# Patient Record
Sex: Female | Born: 1955 | Race: White | Hispanic: No | Smoking: Current every day smoker
Health system: Southern US, Community
[De-identification: ages and names within clinical notes are randomized; demographics above are authoritative.]

## PROBLEM LIST (undated history)

## (undated) DIAGNOSIS — F4310 Post-traumatic stress disorder, unspecified: Secondary | ICD-10-CM

## (undated) DIAGNOSIS — I10 Essential (primary) hypertension: Secondary | ICD-10-CM

## (undated) DIAGNOSIS — G43909 Migraine, unspecified, not intractable, without status migrainosus: Secondary | ICD-10-CM

## (undated) DIAGNOSIS — N390 Urinary tract infection, site not specified: Secondary | ICD-10-CM

## (undated) DIAGNOSIS — F419 Anxiety disorder, unspecified: Secondary | ICD-10-CM

## (undated) DIAGNOSIS — K219 Gastro-esophageal reflux disease without esophagitis: Secondary | ICD-10-CM

## (undated) DIAGNOSIS — L989 Disorder of the skin and subcutaneous tissue, unspecified: Secondary | ICD-10-CM

## (undated) DIAGNOSIS — F909 Attention-deficit hyperactivity disorder, unspecified type: Secondary | ICD-10-CM

## (undated) DIAGNOSIS — T7840XA Allergy, unspecified, initial encounter: Secondary | ICD-10-CM

## (undated) DIAGNOSIS — J3081 Allergic rhinitis due to animal (cat) (dog) hair and dander: Secondary | ICD-10-CM

## (undated) DIAGNOSIS — M8580 Other specified disorders of bone density and structure, unspecified site: Secondary | ICD-10-CM

## (undated) DIAGNOSIS — M1990 Unspecified osteoarthritis, unspecified site: Secondary | ICD-10-CM

## (undated) DIAGNOSIS — J189 Pneumonia, unspecified organism: Secondary | ICD-10-CM

## (undated) HISTORY — DX: Attention-deficit hyperactivity disorder, unspecified type: F90.9

## (undated) HISTORY — DX: Gastro-esophageal reflux disease without esophagitis: K21.9

## (undated) HISTORY — DX: Allergy, unspecified, initial encounter: T78.40XA

## (undated) HISTORY — DX: Post-traumatic stress disorder, unspecified: F43.10

## (undated) HISTORY — PX: NOSE SURGERY: SHX723

## (undated) HISTORY — DX: Urinary tract infection, site not specified: N39.0

## (undated) HISTORY — DX: Essential (primary) hypertension: I10

## (undated) HISTORY — PX: BASAL CELL CARCINOMA EXCISION: SHX1214

## (undated) HISTORY — DX: Anxiety disorder, unspecified: F41.9

## (undated) HISTORY — PX: BREAST ENHANCEMENT SURGERY: SHX7

## (undated) HISTORY — DX: Disorder of the skin and subcutaneous tissue, unspecified: L98.9

## (undated) HISTORY — PX: LIPOSUCTION: SHX10

## (undated) HISTORY — DX: Migraine, unspecified, not intractable, without status migrainosus: G43.909

---

## 2005-08-03 ENCOUNTER — Inpatient Hospital Stay (HOSPITAL_COMMUNITY): Admission: RE | Admit: 2005-08-03 | Discharge: 2005-08-13 | Payer: Self-pay | Admitting: *Deleted

## 2005-08-03 ENCOUNTER — Emergency Department (HOSPITAL_COMMUNITY): Admission: EM | Admit: 2005-08-03 | Discharge: 2005-08-03 | Payer: Self-pay | Admitting: Emergency Medicine

## 2005-08-04 ENCOUNTER — Ambulatory Visit: Payer: Self-pay | Admitting: *Deleted

## 2005-08-13 ENCOUNTER — Other Ambulatory Visit (HOSPITAL_COMMUNITY): Admission: RE | Admit: 2005-08-13 | Discharge: 2005-11-11 | Payer: Self-pay | Admitting: Psychiatry

## 2005-09-26 ENCOUNTER — Ambulatory Visit: Payer: Self-pay | Admitting: Psychiatry

## 2005-10-31 ENCOUNTER — Ambulatory Visit: Payer: Self-pay | Admitting: Psychiatry

## 2006-12-04 ENCOUNTER — Ambulatory Visit: Payer: Self-pay | Admitting: Internal Medicine

## 2006-12-04 LAB — CONVERTED CEMR LAB
Albumin: 5 g/dL (ref 3.5–5.2)
BUN: 15 mg/dL (ref 6–23)
CO2: 22 meq/L (ref 19–32)
Calcium: 9.5 mg/dL (ref 8.4–10.5)
Chloride: 108 meq/L (ref 96–112)
Glucose, Bld: 98 mg/dL (ref 70–99)
HDL: 74 mg/dL (ref >39)
Sodium: 142 meq/L (ref 135–145)
TSH: 0.554 microintl units/mL (ref 0.350–5.50)
Total Bilirubin: 0.3 mg/dL (ref 0.3–1.2)
Total CHOL/HDL Ratio: 3.1
Triglycerides: 186 mg/dL — ABNORMAL HIGH (ref <150)
VLDL: 37 mg/dL (ref 0–40)

## 2006-12-09 ENCOUNTER — Ambulatory Visit: Payer: Self-pay | Admitting: Internal Medicine

## 2006-12-09 DIAGNOSIS — F329 Major depressive disorder, single episode, unspecified: Secondary | ICD-10-CM

## 2006-12-09 DIAGNOSIS — F4321 Adjustment disorder with depressed mood: Secondary | ICD-10-CM | POA: Insufficient documentation

## 2006-12-26 ENCOUNTER — Ambulatory Visit: Payer: Self-pay | Admitting: Internal Medicine

## 2006-12-27 ENCOUNTER — Ambulatory Visit: Payer: Self-pay | Admitting: *Deleted

## 2007-01-30 ENCOUNTER — Ambulatory Visit: Payer: Self-pay | Admitting: Internal Medicine

## 2007-01-31 ENCOUNTER — Ambulatory Visit (HOSPITAL_COMMUNITY): Admission: RE | Admit: 2007-01-31 | Discharge: 2007-01-31 | Payer: Self-pay | Admitting: Internal Medicine

## 2007-03-25 ENCOUNTER — Ambulatory Visit: Payer: Self-pay | Admitting: Internal Medicine

## 2007-05-28 ENCOUNTER — Ambulatory Visit: Payer: Self-pay | Admitting: Internal Medicine

## 2007-07-30 ENCOUNTER — Ambulatory Visit: Payer: Self-pay | Admitting: Internal Medicine

## 2007-08-01 ENCOUNTER — Ambulatory Visit: Payer: Self-pay | Admitting: Internal Medicine

## 2007-08-29 ENCOUNTER — Ambulatory Visit: Payer: Self-pay | Admitting: Internal Medicine

## 2008-01-01 ENCOUNTER — Ambulatory Visit: Payer: Self-pay | Admitting: Internal Medicine

## 2008-03-04 ENCOUNTER — Ambulatory Visit: Payer: Self-pay | Admitting: Internal Medicine

## 2008-07-27 ENCOUNTER — Ambulatory Visit: Payer: Self-pay | Admitting: Internal Medicine

## 2008-12-02 ENCOUNTER — Ambulatory Visit: Payer: Self-pay | Admitting: Internal Medicine

## 2008-12-02 LAB — CONVERTED CEMR LAB
Calcium: 9.4 mg/dL (ref 8.4–10.5)
Chloride: 101 meq/L (ref 96–112)
HDL: 113 mg/dL (ref >39)
LDL Cholesterol: 89 mg/dL (ref 0–99)
Triglycerides: 76 mg/dL (ref <150)
VLDL: 15 mg/dL (ref 0–40)

## 2008-12-29 ENCOUNTER — Encounter (INDEPENDENT_AMBULATORY_CARE_PROVIDER_SITE_OTHER): Payer: Self-pay | Admitting: Internal Medicine

## 2008-12-29 ENCOUNTER — Ambulatory Visit: Payer: Self-pay | Admitting: Internal Medicine

## 2009-01-03 ENCOUNTER — Ambulatory Visit (HOSPITAL_COMMUNITY): Admission: RE | Admit: 2009-01-03 | Discharge: 2009-01-03 | Payer: Self-pay | Admitting: Internal Medicine

## 2009-01-24 ENCOUNTER — Ambulatory Visit: Payer: Self-pay | Admitting: Internal Medicine

## 2009-04-26 ENCOUNTER — Ambulatory Visit: Payer: Self-pay | Admitting: Family Medicine

## 2009-04-26 ENCOUNTER — Other Ambulatory Visit: Admission: RE | Admit: 2009-04-26 | Discharge: 2009-04-26 | Payer: Self-pay | Admitting: Family Medicine

## 2009-06-14 ENCOUNTER — Ambulatory Visit: Payer: Self-pay | Admitting: Internal Medicine

## 2009-06-15 ENCOUNTER — Ambulatory Visit (HOSPITAL_COMMUNITY): Admission: RE | Admit: 2009-06-15 | Discharge: 2009-06-15 | Payer: Self-pay | Admitting: Internal Medicine

## 2009-06-16 ENCOUNTER — Ambulatory Visit: Payer: Self-pay | Admitting: Internal Medicine

## 2009-10-31 ENCOUNTER — Ambulatory Visit: Payer: Self-pay | Admitting: Internal Medicine

## 2010-03-12 ENCOUNTER — Encounter: Payer: Self-pay | Admitting: Internal Medicine

## 2010-04-13 ENCOUNTER — Encounter (INDEPENDENT_AMBULATORY_CARE_PROVIDER_SITE_OTHER): Payer: Self-pay | Admitting: Family Medicine

## 2010-04-13 LAB — CONVERTED CEMR LAB
ALT: 31 units/L (ref 0–35)
AST: 36 units/L (ref 0–37)
Albumin: 4.7 g/dL (ref 3.5–5.2)
Alkaline Phosphatase: 89 units/L (ref 39–117)
BUN: 12 mg/dL (ref 6–23)
CO2: 23 meq/L (ref 19–32)
Calcium: 9.6 mg/dL (ref 8.4–10.5)
Chloride: 102 meq/L (ref 96–112)
Cholesterol: 269 mg/dL — ABNORMAL HIGH (ref 0–200)
Creatinine, Ser: 0.63 mg/dL (ref 0.40–1.20)
Glucose, Bld: 93 mg/dL (ref 70–99)
HDL: 77 mg/dL (ref >39)
LDL Cholesterol: 162 mg/dL — ABNORMAL HIGH (ref 0–99)
Potassium: 4.5 meq/L (ref 3.5–5.3)
Sodium: 140 meq/L (ref 135–145)
Total Bilirubin: 0.6 mg/dL (ref 0.3–1.2)
Total CHOL/HDL Ratio: 3.5
Total Protein: 7.4 g/dL (ref 6.0–8.3)
Triglycerides: 149 mg/dL (ref <150)
VLDL: 30 mg/dL (ref 0–40)
Vit D, 25-Hydroxy: 44 ng/mL (ref 30–89)

## 2010-07-07 NOTE — Discharge Summary (Signed)
NAME:  Julie Carson, Julie Carson          ACCOUNT NO.:  [id number]   MEDICAL RECORD NO.:  [id number]          PATIENT TYPE:  IPS   LOCATION:  0303                          FACILITY:  [facility]   PHYSICIAN:  Anselm Jungling, MD  DATE OF BIRTH:  1955-07-01   DATE OF ADMISSION:  08/03/2005  DATE OF DISCHARGE:  08/13/2005                                 DISCHARGE SUMMARY   IDENTIFYING DATA AND REASON FOR ADMISSION:  This was the first [facility] admission  for Julie Carson, a 55 year old unmarried Caucasian female who was admitted for  treatment of substance abuse and depression.  She had a long history of  alcoholism, and had had some periods of sobriety previously but had relapsed  some time prior to admission.  She also had a history of depression.  Please  refer to the admission note for further details pertaining to the symptoms,  circumstances and history that led to her hospitalization.   MEDICAL AND LABORATORY:  The patient was evaluated medically and physically  by the psychiatric nurse practitioner.  She came to [place] without any chronic  or active medical problems, essentially in good health.  Admission  laboratory showed mild elevations of liver function enzymes, but normal  bilirubin indices, and essentially normal CBC.   HOSPITAL COURSE:  The patient was admitted to the adult inpatient  psychiatric service.  She was placed on a withdrawal protocol based upon  Librium.  In addition she was placed on Campral 666 mg t.i.d..  Celexa 20  mg, 1/2 tablet daily was prescribed for depression.   The patient reported that in previous instances of coming off alcohol and  benzodiazepines, that she experienced grand mal seizure.  Based upon this  reporting, the patient was placed on a further regimen of Tegretol 300 mg  t.i.d..  All of the above medications were well tolerated.  The patient had  mild to moderate anxiety and tremulousness throughout much of her inpatient  stay.  Sleep was generally fairly good with  Ambien 10 mg at bedtime.   The patient was a good participant in the treatment program.  She presented  as a well nourished, well-developed, organized, bright and articulate  individual who was strongly motivated to achieve sobriety and long-term  recovery.  She was a good participant in treatment program and along with  peers and staff alike.   The patient was felt to be ready for discharge on the 11th hospital day at  which time she was transferred directly to the intensive outpatient chemical  dependency program here at [facility].   AFTERCARE:  The patient was to follow-up on the very same day of discharge  at the IOP program.  There, she will be followed by Dr. Dub Mikes for medication  maintenance.   DISCHARGE MEDICATIONS:  Campral 666 mg t.i.d., Tegretol 300 mg b.i.d.,  Celexa 10 mg daily, Vistaril 25 mg up to t.i.d. as needed for anxiety,  Librium 10 mg t.i.d. on June 25 and June 26 and Librium 10 mg b.i.d. on June  27 and June 28, and Librium 10 mg q.a.m. on June 29 and June 30, then  DC  Librium, and Ambien 10 mg h.s. p.r.n. insomnia.   DISCHARGE DIAGNOSES:  AXIS I: Alcohol dependence, benzodiazepine dependence,  depressive disorder NOS.  AXIS II: Deferred.  AXIS III: No acute or chronic illnesses.  AXIS IV: Stressors severe.  AXIS V: Global assessment of functioning on discharge 75.           ______________________________  Anselm Jungling, MD  Electronically Signed     SPB/MEDQ  D:  08/13/2005  T:  08/13/2005  Job:  [id number]

## 2011-02-20 DIAGNOSIS — L989 Disorder of the skin and subcutaneous tissue, unspecified: Secondary | ICD-10-CM

## 2011-02-20 HISTORY — DX: Disorder of the skin and subcutaneous tissue, unspecified: L98.9

## 2011-08-22 ENCOUNTER — Other Ambulatory Visit (HOSPITAL_COMMUNITY): Payer: Self-pay | Admitting: Family Medicine

## 2011-08-22 DIAGNOSIS — D179 Benign lipomatous neoplasm, unspecified: Secondary | ICD-10-CM

## 2011-08-28 ENCOUNTER — Ambulatory Visit (HOSPITAL_COMMUNITY)
Admission: RE | Admit: 2011-08-28 | Discharge: 2011-08-28 | Disposition: A | Discharge status: Home / Self Care | Payer: Self-pay | Source: Ambulatory Visit | Attending: Family Medicine | Admitting: Family Medicine

## 2011-08-28 DIAGNOSIS — D179 Benign lipomatous neoplasm, unspecified: Secondary | ICD-10-CM | POA: Insufficient documentation

## 2011-08-29 ENCOUNTER — Other Ambulatory Visit (HOSPITAL_COMMUNITY): Payer: Self-pay | Admitting: Family Medicine

## 2011-08-29 ENCOUNTER — Ambulatory Visit (HOSPITAL_COMMUNITY)
Admission: RE | Admit: 2011-08-29 | Discharge: 2011-08-29 | Disposition: A | Discharge status: Home / Self Care | Payer: Self-pay | Source: Ambulatory Visit | Attending: Family Medicine | Admitting: Family Medicine

## 2011-08-30 ENCOUNTER — Ambulatory Visit (HOSPITAL_COMMUNITY)
Admission: RE | Admit: 2011-08-30 | Discharge: 2011-08-30 | Disposition: A | Discharge status: Home / Self Care | Payer: Self-pay | Source: Ambulatory Visit | Attending: Family Medicine | Admitting: Family Medicine

## 2011-08-30 DIAGNOSIS — M67919 Unspecified disorder of synovium and tendon, unspecified shoulder: Secondary | ICD-10-CM | POA: Insufficient documentation

## 2011-08-30 DIAGNOSIS — M719 Bursopathy, unspecified: Secondary | ICD-10-CM | POA: Insufficient documentation

## 2011-08-30 DIAGNOSIS — S46819A Strain of other muscles, fascia and tendons at shoulder and upper arm level, unspecified arm, initial encounter: Secondary | ICD-10-CM | POA: Insufficient documentation

## 2011-08-30 DIAGNOSIS — X58XXXA Exposure to other specified factors, initial encounter: Secondary | ICD-10-CM | POA: Insufficient documentation

## 2011-08-30 DIAGNOSIS — R229 Localized swelling, mass and lump, unspecified: Secondary | ICD-10-CM | POA: Insufficient documentation

## 2011-08-30 DIAGNOSIS — Z01818 Encounter for other preprocedural examination: Secondary | ICD-10-CM | POA: Insufficient documentation

## 2011-08-30 DIAGNOSIS — Z01812 Encounter for preprocedural laboratory examination: Secondary | ICD-10-CM | POA: Insufficient documentation

## 2011-08-30 LAB — CREATININE, SERUM
GFR calc Af Amer: >90 mL/min (ref >90)
GFR calc non Af Amer: >90 mL/min (ref >90)

## 2011-08-30 MED ORDER — GADOBENATE DIMEGLUMINE 529 MG/ML IV SOLN
10.0000 mL | Freq: Once | INTRAVENOUS | Status: AC
  Administered 2011-08-30: 10 mL via INTRAVENOUS

## 2011-09-28 ENCOUNTER — Encounter (INDEPENDENT_AMBULATORY_CARE_PROVIDER_SITE_OTHER): Payer: Self-pay

## 2011-10-02 ENCOUNTER — Encounter (INDEPENDENT_AMBULATORY_CARE_PROVIDER_SITE_OTHER): Payer: Self-pay | Admitting: General Surgery

## 2011-10-02 ENCOUNTER — Ambulatory Visit (INDEPENDENT_AMBULATORY_CARE_PROVIDER_SITE_OTHER): Payer: PRIVATE HEALTH INSURANCE | Admitting: General Surgery

## 2011-10-02 VITALS — BP 138/82 | HR 84 | Temp 97.0°F | Resp 18 | Ht 62.0 in | Wt 137.0 lb

## 2011-10-02 DIAGNOSIS — D1720 Benign lipomatous neoplasm of skin and subcutaneous tissue of unspecified limb: Secondary | ICD-10-CM | POA: Insufficient documentation

## 2011-10-02 DIAGNOSIS — R2230 Localized swelling, mass and lump, unspecified upper limb: Secondary | ICD-10-CM

## 2011-10-02 DIAGNOSIS — M259 Joint disorder, unspecified: Secondary | ICD-10-CM

## 2011-10-02 NOTE — Patient Instructions (Signed)
YOu will be scheduled for surgery.  Please refer to the schedulers information for date and time.

## 2011-10-02 NOTE — Progress Notes (Signed)
Chief Complaint  Patient presents with  . Mass    HISTORY: 56yo F who has had a mass on her L shoulder for 3 years that is slowly growing.  She states that the area has become painful.  She has had an [place], which was suspicious and an MRI which showed a fibrous lesion within her subcutaneous fat just superior to her deltoid but could not rule out malignancy.  She is here today to discuss biopsy.  She has a history of a fall with injury to that side 3 yrs ago.  Past Medical History  Diagnosis Date  . Anxiety   . GERD (gastroesophageal reflux disease)   . PTSD (post-traumatic stress disorder)   . ADHD (attention deficit hyperactivity disorder)   . Migraine   . UTI (urinary tract infection)   . Hypertension   . Allergy   . Lesion of subcutaneous tissue 2013    Deltoid    PSH:  Cosmetic surgeries, Basal cell carcinoma- L chest/neck - 1980's  Current Outpatient Prescriptions  Medication Sig Dispense Refill  . ALPRAZolam (XANAX) 0.5 MG tablet Take 0.5 mg by mouth 3 (three) times daily as needed.       Marland Kitchen amphetamine-dextroamphetamine (ADDERALL) 20 MG tablet Take 20 mg by mouth 2 (two) times daily.      . hydrochlorothiazide (HYDRODIURIL) 25 MG tablet Take 25 mg by mouth daily.      Marland Kitchen omeprazole (PRILOSEC) 40 MG capsule Take 40 mg by mouth daily.      . ranitidine (ZANTAC) 150 MG tablet Take 150 mg by mouth 2 (two) times daily as needed.       No Known Allergies   FH: CVA, CAD   History   Social History  . Marital Status: Divorced    Spouse Name: N/A    Number of Children: N/A  . Years of Education: N/A   Social History Main Topics  . Smoking status: Never Smoker   . Smokeless tobacco: None  . Alcohol Use: Yes  . Drug Use: No  . Sexually Active: None   Other Topics Concern  . None   Social History Narrative  . None   REVIEW OF SYSTEMS - PERTINENT POSITIVES ONLY: 12 point review of systems negative other than HPI and PMH except for nasal congestion, palpitations,  arthritis, headaches and weakness  EXAM: Filed Vitals:   10/02/11 0904  BP: 138/82  Pulse: 84  Temp: 97 F (36.1 C)  Resp: 18   Gen:  No acute distress.  Well nourished and well groomed.   Neurological: Alert and oriented to person, place, and time. Coordination normal. Anxious  Head: Normocephalic and atraumatic.  Eyes: Conjunctivae are normal. Pupils are equal, round, and reactive to light. No scleral icterus.  Neck: Normal range of motion. Neck supple. No tracheal deviation or thyromegaly present.  No cervical lymphadenopathy. Cardiovascular: Normal rate, regular rhythm, normal heart sounds and intact distal pulses.  Exam reveals no gallop and no friction rub.  No murmur heard. Respiratory: Effort normal.  No respiratory distress. No chest wall tenderness. Breath sounds normal.  No wheezes, rales or rhonchi.  GI: Soft. Bowel sounds are normal. The abdomen is soft and nontender.  There is no rebound and no guarding.  Musculoskeletal: Normal range of motion. L shoulder mass at deltoid tendon, tender to palpation, nonmobile ~2-3cm in size.  No palpable L axillary lymphadenopathy Skin: Skin is warm and dry. No rash noted. No diaphoresis. No erythema. No pallor. No clubbing, cyanosis, or  edema.   Psychiatric: Normal mood and affect. Behavior is normal. Judgment and thought content normal.   RADIOLOGY RESULTS:   Images and reports are reviewed MR (08/29/11): 1. Irregular fibrous lesion in the subcutaneous fat along the posterior inferior margin of the deltoid muscle demonstrates enhancement following contrast. Differential considerations include post-traumatic fibrosis, nodular fasciitis and superficial fibromatosis. An early malignant fibrous lesion cannot be excluded. If the patient has not had prior trauma to this area, excisional biopsy of this lesion should be considered. If the lesion is not removed, close clinical follow-up is warranted.  2. Underlying left shoulder glenohumeral  degenerative changes and synovial inflammation.  3. Partial insertional tear of the distal supraspinatus tendon, suboptimally evaluated.  [place] (08/22/11) 2.5 cm irregular, hypoechoic mass with posterior physical shadowing at the location of the palpable abnormality on the left. This has ultrasound features highly suspicious for malignancy. Further evaluation with pre and postcontrast magnetic resonance imaging of this area is recommended.    ASSESSMENT AND Plan L shoulder mass.  Could not rule out malignancy on MRI.  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  Will get incisional biopsy to confirm.  Discussed with the patient that I will only be getting a biopsy and not removing the mass.  I discussed with her the need for an orthopedic surgeon if this happens to be something more serious.  She was agreeable to this plan and will be scheduled for outpatient surgery.   Vanita Panda, MD Colon and Rectal Surgery / General Surgery [facility]      Visit Diagnoses: 1. Mass of shoulder region     Primary Care Physician: Georganna Skeans, MD

## 2011-10-05 ENCOUNTER — Ambulatory Visit (INDEPENDENT_AMBULATORY_CARE_PROVIDER_SITE_OTHER): Payer: Self-pay | Admitting: General Surgery

## 2011-10-09 ENCOUNTER — Telehealth (INDEPENDENT_AMBULATORY_CARE_PROVIDER_SITE_OTHER): Payer: Self-pay

## 2011-10-09 NOTE — Telephone Encounter (Signed)
The patient is starting to get anxious about surgery and has questions.  She has been reading her image reports.  She is concerned for cancer.  I told her until tissue is removed and sent to pathology we don't know what the mass is.  I read Dr Maisie Fus' note that said she was just doing a bx and not removing the mass.  The patient wishes it would just be removed.  She also wants to know what kind of anesthesia she will undergo.  I told her I can have Dr Maisie Fus call her and answer these questions for her.  The patient would like that.  She said she can call her home # or cell.  I told her it may be tomorrow.  She is ok with that.

## 2011-10-10 ENCOUNTER — Telehealth (INDEPENDENT_AMBULATORY_CARE_PROVIDER_SITE_OTHER): Payer: Self-pay | Admitting: General Surgery

## 2011-10-10 NOTE — Telephone Encounter (Signed)
Called patient and discussed her questions.  It sounds like she is mostly anxious about the outcome of her biopsy.  All questions answered.  We will proceed as planned.

## 2011-10-12 ENCOUNTER — Encounter (HOSPITAL_COMMUNITY): Payer: Self-pay | Admitting: Pharmacy Technician

## 2011-10-17 ENCOUNTER — Encounter (HOSPITAL_COMMUNITY)
Admission: RE | Admit: 2011-10-17 | Discharge: 2011-10-17 | Disposition: A | Discharge status: Home / Self Care | Payer: Self-pay | Source: Ambulatory Visit | Attending: General Surgery | Admitting: General Surgery

## 2011-10-17 ENCOUNTER — Ambulatory Visit (HOSPITAL_COMMUNITY)
Admission: RE | Admit: 2011-10-17 | Discharge: 2011-10-17 | Disposition: A | Discharge status: Home / Self Care | Payer: Self-pay | Source: Ambulatory Visit | Attending: General Surgery | Admitting: General Surgery

## 2011-10-17 ENCOUNTER — Encounter (HOSPITAL_COMMUNITY): Payer: Self-pay

## 2011-10-17 ENCOUNTER — Other Ambulatory Visit (INDEPENDENT_AMBULATORY_CARE_PROVIDER_SITE_OTHER): Payer: Self-pay | Admitting: General Surgery

## 2011-10-17 ENCOUNTER — Telehealth (INDEPENDENT_AMBULATORY_CARE_PROVIDER_SITE_OTHER): Payer: Self-pay

## 2011-10-17 DIAGNOSIS — Z01812 Encounter for preprocedural laboratory examination: Secondary | ICD-10-CM | POA: Insufficient documentation

## 2011-10-17 DIAGNOSIS — Z01818 Encounter for other preprocedural examination: Secondary | ICD-10-CM | POA: Insufficient documentation

## 2011-10-17 HISTORY — DX: Pneumonia, unspecified organism: J18.9

## 2011-10-17 HISTORY — DX: Unspecified osteoarthritis, unspecified site: M19.90

## 2011-10-17 HISTORY — DX: Other specified disorders of bone density and structure, unspecified site: M85.80

## 2011-10-17 HISTORY — DX: Allergic rhinitis due to animal (cat) (dog) hair and dander: J30.81

## 2011-10-17 LAB — CBC
HCT: 42.5 % (ref 36.0–46.0)
MCHC: 32.9 g/dL (ref 30.0–36.0)
Platelets: 271 10*3/uL (ref 150–400)
RDW: 13.2 % (ref 11.5–15.5)

## 2011-10-17 LAB — BASIC METABOLIC PANEL
BUN: 9 mg/dL (ref 6–23)
Calcium: 10.3 mg/dL (ref 8.4–10.5)
Creatinine, Ser: 0.55 mg/dL (ref 0.50–1.10)
GFR calc Af Amer: >90 mL/min (ref >90)
GFR calc non Af Amer: >90 mL/min (ref >90)

## 2011-10-17 LAB — SURGICAL PCR SCREEN: MRSA, PCR: NEGATIVE

## 2011-10-17 NOTE — Telephone Encounter (Signed)
Nurse calling b/c the consent for pt's surgery form is not complete. Pls look at surgical consent in epic orders.

## 2011-10-17 NOTE — Pre-Procedure Instructions (Signed)
18 Julie Carson  10/17/2011   Your procedure is scheduled on:  Wednesday October 24, 2011.  Report to [facility] at 0630 AM.  Call this number if you have problems the morning of surgery: [phone]   Remember:   Do not eat food or drink:After Midnight.    Take these medicines the morning of surgery with A SIP OF WATER: Alprazolam (Xanax) if needed for anxiety, and Ranitidine (Zantac)   Do not wear jewelry, make-up or nail polish.  Do not wear lotions, powders, or perfumes.   Do not shave 48 hours prior to surgery.   Do not bring valuables to the hospital.  Contacts, dentures or bridgework may not be worn into surgery.  Leave suitcase in the car. After surgery it may be brought to your room.  For patients admitted to the hospital, checkout time is 11:00 AM the day of discharge.   Patients discharged the day of surgery will not be allowed to drive home.  Name and phone number of your driver:   Special Instructions: CHG Shower Use Special Wash: 1/2 bottle night before surgery and 1/2 bottle morning of surgery.   Please read over the following fact sheets that you were given: Pain Booklet, Coughing and Deep Breathing, MRSA Information and Surgical Site Infection Prevention

## 2011-10-17 NOTE — Progress Notes (Signed)
Nurse called Dr. Maurine Minister office and left message with staff informing them that the consent form did not have the location of which shoulder the biopsy would be taken from. Office staff informed Nurse that Dr. Maisie Fus was not in the office but they would send a message to her about my concern. Patient aware that consent form will be signed the DOS.

## 2011-10-24 ENCOUNTER — Encounter (HOSPITAL_COMMUNITY)
Admission: RE | Disposition: A | Discharge status: Home / Self Care | Payer: Self-pay | Source: Ambulatory Visit | Attending: General Surgery

## 2011-10-24 ENCOUNTER — Ambulatory Visit (HOSPITAL_COMMUNITY): Payer: Self-pay | Admitting: Anesthesiology

## 2011-10-24 ENCOUNTER — Encounter (HOSPITAL_COMMUNITY): Payer: Self-pay | Admitting: Anesthesiology

## 2011-10-24 ENCOUNTER — Ambulatory Visit (HOSPITAL_COMMUNITY)
Admission: RE | Admit: 2011-10-24 | Discharge: 2011-10-24 | Disposition: A | Discharge status: Home / Self Care | Payer: Self-pay | Source: Ambulatory Visit | Attending: General Surgery | Admitting: General Surgery

## 2011-10-24 DIAGNOSIS — D1739 Benign lipomatous neoplasm of skin and subcutaneous tissue of other sites: Secondary | ICD-10-CM | POA: Insufficient documentation

## 2011-10-24 DIAGNOSIS — Z87440 Personal history of urinary (tract) infections: Secondary | ICD-10-CM | POA: Insufficient documentation

## 2011-10-24 DIAGNOSIS — Z87891 Personal history of nicotine dependence: Secondary | ICD-10-CM | POA: Insufficient documentation

## 2011-10-24 DIAGNOSIS — F4310 Post-traumatic stress disorder, unspecified: Secondary | ICD-10-CM | POA: Insufficient documentation

## 2011-10-24 DIAGNOSIS — K219 Gastro-esophageal reflux disease without esophagitis: Secondary | ICD-10-CM | POA: Insufficient documentation

## 2011-10-24 DIAGNOSIS — F909 Attention-deficit hyperactivity disorder, unspecified type: Secondary | ICD-10-CM | POA: Insufficient documentation

## 2011-10-24 DIAGNOSIS — E042 Nontoxic multinodular goiter: Secondary | ICD-10-CM

## 2011-10-24 DIAGNOSIS — I10 Essential (primary) hypertension: Secondary | ICD-10-CM | POA: Insufficient documentation

## 2011-10-24 HISTORY — PX: MASS EXCISION: SHX2000

## 2011-10-24 SURGERY — EXCISION MASS
Anesthesia: General | Site: Arm Upper | Laterality: Left | Wound class: Clean

## 2011-10-24 MED ORDER — CEFAZOLIN SODIUM-DEXTROSE 2-3 GM-% IV SOLR
INTRAVENOUS | Status: AC
  Filled 2011-10-24: qty 50

## 2011-10-24 MED ORDER — OXYCODONE-ACETAMINOPHEN 5-325 MG PO TABS: 1.0000 | ORAL_TABLET | ORAL | Status: AC | PRN

## 2011-10-24 MED ORDER — LIDOCAINE HCL (CARDIAC) 20 MG/ML IV SOLN
INTRAVENOUS | Status: DC | PRN
  Administered 2011-10-24: 100 mg via INTRAVENOUS

## 2011-10-24 MED ORDER — OXYCODONE HCL 5 MG PO TABS
ORAL_TABLET | ORAL | Status: AC
  Filled 2011-10-24: qty 2

## 2011-10-24 MED ORDER — PROMETHAZINE HCL 25 MG/ML IJ SOLN: 6.2500 mg | INTRAMUSCULAR | Status: DC | PRN

## 2011-10-24 MED ORDER — ACETAMINOPHEN 650 MG RE SUPP: 650.0000 mg | RECTAL | Status: DC | PRN

## 2011-10-24 MED ORDER — FENTANYL CITRATE 0.05 MG/ML IJ SOLN
INTRAMUSCULAR | Status: DC | PRN
  Administered 2011-10-24: 100 ug via INTRAVENOUS
  Administered 2011-10-24: 50 ug via INTRAVENOUS
  Administered 2011-10-24: 100 ug via INTRAVENOUS

## 2011-10-24 MED ORDER — SODIUM CHLORIDE 0.9 % IJ SOLN: 3.0000 mL | Freq: Two times a day (BID) | INTRAMUSCULAR | Status: DC

## 2011-10-24 MED ORDER — PROPOFOL 10 MG/ML IV EMUL
INTRAVENOUS | Status: DC | PRN
  Administered 2011-10-24: 200 mg via INTRAVENOUS

## 2011-10-24 MED ORDER — OXYCODONE HCL 5 MG PO TABS
5.0000 mg | ORAL_TABLET | ORAL | Status: DC | PRN
  Administered 2011-10-24: 10 mg via ORAL

## 2011-10-24 MED ORDER — CEFAZOLIN SODIUM-DEXTROSE 2-3 GM-% IV SOLR
INTRAVENOUS | Status: DC | PRN
  Administered 2011-10-24: 2 g via INTRAVENOUS

## 2011-10-24 MED ORDER — ACETAMINOPHEN 325 MG PO TABS: 650.0000 mg | ORAL_TABLET | ORAL | Status: DC | PRN

## 2011-10-24 MED ORDER — ONDANSETRON HCL 4 MG/2ML IJ SOLN: 4.0000 mg | Freq: Four times a day (QID) | INTRAMUSCULAR | Status: DC | PRN

## 2011-10-24 MED ORDER — MIDAZOLAM HCL 5 MG/5ML IJ SOLN
INTRAMUSCULAR | Status: DC | PRN
  Administered 2011-10-24 (×2): 2 mg via INTRAVENOUS

## 2011-10-24 MED ORDER — 0.9 % SODIUM CHLORIDE (POUR BTL) OPTIME
TOPICAL | Status: DC | PRN
  Administered 2011-10-24: 1000 mL

## 2011-10-24 MED ORDER — ONDANSETRON HCL 4 MG/2ML IJ SOLN
INTRAMUSCULAR | Status: DC | PRN
  Administered 2011-10-24: 4 mg via INTRAVENOUS

## 2011-10-24 MED ORDER — BUPIVACAINE-EPINEPHRINE PF 0.25-1:200000 % IJ SOLN
INTRAMUSCULAR | Status: AC
  Filled 2011-10-24: qty 30

## 2011-10-24 MED ORDER — HYDROMORPHONE HCL PF 1 MG/ML IJ SOLN
INTRAMUSCULAR | Status: AC
  Filled 2011-10-24: qty 1

## 2011-10-24 MED ORDER — SODIUM CHLORIDE 0.9 % IV SOLN: 250.0000 mL | INTRAVENOUS | Status: DC | PRN

## 2011-10-24 MED ORDER — BUPIVACAINE-EPINEPHRINE 0.25% -1:200000 IJ SOLN
INTRAMUSCULAR | Status: DC | PRN
  Administered 2011-10-24: 10 mL

## 2011-10-24 MED ORDER — LACTATED RINGERS IV SOLN
INTRAVENOUS | Status: DC | PRN
  Administered 2011-10-24: 09:00:00 via INTRAVENOUS

## 2011-10-24 MED ORDER — SODIUM CHLORIDE 0.9 % IJ SOLN
3.0000 mL | INTRAMUSCULAR | Status: DC | PRN
Start: 2011-10-24 — End: 2011-10-24

## 2011-10-24 MED ORDER — HYDROMORPHONE HCL PF 1 MG/ML IJ SOLN
0.2500 mg | INTRAMUSCULAR | Status: DC | PRN
  Administered 2011-10-24: 0.5 mg via INTRAVENOUS

## 2011-10-24 SURGICAL SUPPLY — 47 items
BLADE SURG 15 STRL LF DISP TIS (BLADE) ×2 IMPLANT
BLADE SURG 15 STRL SS (BLADE) ×1
CANISTER SUCTION 2500CC (MISCELLANEOUS) IMPLANT
CHLORAPREP W/TINT 10.5 ML (MISCELLANEOUS) ×3 IMPLANT
CHLORAPREP W/TINT 26ML (MISCELLANEOUS) IMPLANT
CLOTH BEACON ORANGE TIMEOUT ST (SAFETY) ×3 IMPLANT
CONT SPEC STER OR (MISCELLANEOUS) IMPLANT
COVER SURGICAL LIGHT HANDLE (MISCELLANEOUS) ×3 IMPLANT
DERMABOND ADHESIVE PROPEN (GAUZE/BANDAGES/DRESSINGS) ×1
DERMABOND ADVANCED (GAUZE/BANDAGES/DRESSINGS)
DERMABOND ADVANCED .7 DNX12 (GAUZE/BANDAGES/DRESSINGS) IMPLANT
DERMABOND ADVANCED .7 DNX6 (GAUZE/BANDAGES/DRESSINGS) ×2 IMPLANT
DRAPE PED LAPAROTOMY (DRAPES) ×3 IMPLANT
DRAPE UTILITY 15X26 W/TAPE STR (DRAPE) ×6 IMPLANT
DRESSING TELFA 8X3 (GAUZE/BANDAGES/DRESSINGS) IMPLANT
ELECT CAUTERY BLADE 6.4 (BLADE) ×3 IMPLANT
ELECT REM PT RETURN 9FT ADLT (ELECTROSURGICAL) ×3
ELECTRODE REM PT RTRN 9FT ADLT (ELECTROSURGICAL) ×2 IMPLANT
GAUZE SPONGE 4X4 16PLY XRAY LF (GAUZE/BANDAGES/DRESSINGS) ×3 IMPLANT
GLOVE BIO SURGEON STRL SZ 6.5 (GLOVE) ×3 IMPLANT
GLOVE BIO SURGEON STRL SZ7 (GLOVE) IMPLANT
GLOVE BIO SURGEON STRL SZ8 (GLOVE) ×3 IMPLANT
GLOVE BIOGEL PI IND STRL 7.0 (GLOVE) ×6 IMPLANT
GLOVE BIOGEL PI IND STRL 7.5 (GLOVE) IMPLANT
GLOVE BIOGEL PI INDICATOR 7.0 (GLOVE) ×3
GLOVE BIOGEL PI INDICATOR 7.5 (GLOVE)
GLOVE ECLIPSE 6.0 STRL STRAW (GLOVE) ×3 IMPLANT
GLOVE SURG SS PI 7.0 STRL IVOR (GLOVE) ×3 IMPLANT
GOWN PREVENTION PLUS XLARGE (GOWN DISPOSABLE) ×3 IMPLANT
GOWN PREVENTION PLUS XXLARGE (GOWN DISPOSABLE) ×3 IMPLANT
GOWN STRL NON-REIN LRG LVL3 (GOWN DISPOSABLE) ×6 IMPLANT
KIT BASIN OR (CUSTOM PROCEDURE TRAY) ×3 IMPLANT
KIT ROOM TURNOVER OR (KITS) ×3 IMPLANT
NEEDLE HYPO 25X1 1.5 SAFETY (NEEDLE) ×3 IMPLANT
NS IRRIG 1000ML POUR BTL (IV SOLUTION) ×3 IMPLANT
PACK SURGICAL SETUP 50X90 (CUSTOM PROCEDURE TRAY) ×3 IMPLANT
PAD ARMBOARD 7.5X6 YLW CONV (MISCELLANEOUS) ×6 IMPLANT
PENCIL BUTTON HOLSTER BLD 10FT (ELECTRODE) ×3 IMPLANT
SUT MON AB 4-0 PC3 18 (SUTURE) ×3 IMPLANT
SUT VIC AB 3-0 SH 18 (SUTURE) ×3 IMPLANT
SYR BULB 3OZ (MISCELLANEOUS) ×3 IMPLANT
SYR CONTROL 10ML LL (SYRINGE) ×3 IMPLANT
TOWEL OR 17X24 6PK STRL BLUE (TOWEL DISPOSABLE) IMPLANT
TOWEL OR 17X26 10 PK STRL BLUE (TOWEL DISPOSABLE) ×3 IMPLANT
TUBE CONNECTING 12X1/4 (SUCTIONS) IMPLANT
WATER STERILE IRR 1000ML POUR (IV SOLUTION) IMPLANT
YANKAUER SUCT BULB TIP NO VENT (SUCTIONS) IMPLANT

## 2011-10-24 NOTE — Progress Notes (Signed)
Patient voiced complaint of pain in left deltoid from surgical biopsy and rated it originally as "10" out of 10. Nurse called Asher Muir in OR room 1 and voiced patients concerns to her. While on phone patient rated pain as "8" out of 10. Jamie informed Nurse that Dr. Maisie Fus did not want to order any other pain medication for patient. Plan of care updated with patient. Patient verbalized understanding.

## 2011-10-24 NOTE — Interval H&P Note (Signed)
History and Physical Interval Note:  10/24/2011 8:34 AM  Julie Carson  has presented today for surgery, with the diagnosis of deltoid mass.  The various methods of treatment have been discussed with the patient and family. After consideration of risks, benefits and other options for treatment, the patient has consented to  Procedure(s) (LRB) with comments: MUSCLE BIOPSY (Left) - incisional biopsy left deltoid as a surgical intervention .  The patient's history has been reviewed, patient examined, no change in status, stable for surgery.  I have reviewed the patient's chart and labs.  Questions were answered to the patient's satisfaction.     Vanita Panda, MD  Colorectal and General Surgery [facility]

## 2011-10-24 NOTE — Brief Op Note (Signed)
10/24/2011  9:45 AM  PATIENT:  Julie Carson  56 y.o. female  PRE-OPERATIVE DIAGNOSIS:  left shoulder mass  POST-OPERATIVE DIAGNOSIS:  left shoulder mass  PROCEDURE:  Procedure(s) (LRB) with comments: Incisional biopsy of L shoulder  SURGEON:  Surgeon(s) and Role:    * [place] Jaquith C. Maisie Fus, MD - Primary    * Valarie Merino, MD - Assisting    ANESTHESIA:   local and MAC  EBL:  Total I/O In: 750 [I.V.:750] Out: -   BLOOD ADMINISTERED:none  DRAINS: none   LOCAL MEDICATIONS USED:  LIDOCAINE  and Amount: 10 ml  SPECIMEN:  Source of Specimen:  L shoulder mass and Biopsy / Limited Resection  DISPOSITION OF SPECIMEN:  PATHOLOGY  COUNTS:  YES  INDICATION:  This is a 56 yo F who presented to the office with a L shoulder mass.  An [place] and MRI could not rule out malignancy and it was decided to obtain an open biopsy.  The risks and benefits of this procedure were explained to the patient prior to the procedure and consent was signed by me and the patient prior to entering the OR.  She was also marked prior to the OR to determine that the correct side of the procedure.  PROCEDURE: The patient was brought to the OR and laid supine on the operating room table.  Anesthesia was induced without difficulty.  The patient was propped up with her left shoulder elevated and her arm tucked.  The area was prepped and draped in the usual fashion.  A time out was performed indicating the correct patient, procedure, pre-operative antibiotics, side and position.  After this 10ml of lidocaine with epinephrine was infused subcutaneously and deep to create a field block.  An incision was made using a 15 blade scalpel along the inferior border of her L deltoid.  This was carried down through the subcutaneous tissue using electrocautery.  Hemostasis was obtained with the bovie as I went down.  The mass was encountered in her subcutaneous tissue.  This appeared to be fibrosis on gross exam.  A piece of the mass was  removed with electrocautery.  This was sent to pathology for further examination.  The mass did not appear to be vascularized.  The wound was then irrigated with normal saline.  The deep tissue was re approximated with 3-0 vicryl sutures.  The skin was then closed with interrupted subcuticular sutures and then covered with dermabond.   The patient was then awakened from anesthesia and sent to the PACU in stable condition.  All counts were correct per operating room staff.  I was present and directly performed the entire procedure.   PLAN OF CARE: Discharge to home after PACU  PATIENT DISPOSITION:  PACU - hemodynamically stable.   Delay start of Pharmacological VTE agent (>24hrs) due to surgical blood loss or risk of bleeding: no

## 2011-10-24 NOTE — H&P (View-Only) (Signed)
Chief Complaint  Patient presents with  . Mass    HISTORY: 56yo F who has had a mass on her L shoulder for 3 years that is slowly growing.  She states that the area has become painful.  She has had an US, which was suspicious and an MRI which showed a fibrous lesion within her subcutaneous fat just superior to her deltoid but could not rule out malignancy.  She is here today to discuss biopsy.  She has a history of a fall with injury to that side 3 yrs ago.  Past Medical History  Diagnosis Date  . Anxiety   . GERD (gastroesophageal reflux disease)   . PTSD (post-traumatic stress disorder)   . ADHD (attention deficit hyperactivity disorder)   . Migraine   . UTI (urinary tract infection)   . Hypertension   . Allergy   . Lesion of subcutaneous tissue 2013    Deltoid    PSH:  Cosmetic surgeries, Basal cell carcinoma- L chest/neck - 1980's  Current Outpatient Prescriptions  Medication Sig Dispense Refill  . ALPRAZolam (XANAX) 0.5 MG tablet Take 0.5 mg by mouth 3 (three) times daily as needed.       . amphetamine-dextroamphetamine (ADDERALL) 20 MG tablet Take 20 mg by mouth 2 (two) times daily.      . hydrochlorothiazide (HYDRODIURIL) 25 MG tablet Take 25 mg by mouth daily.      . omeprazole (PRILOSEC) 40 MG capsule Take 40 mg by mouth daily.      . ranitidine (ZANTAC) 150 MG tablet Take 150 mg by mouth 2 (two) times daily as needed.       No Known Allergies   FH: CVA, CAD   History   Social History  . Marital Status: Divorced    Spouse Name: N/A    Number of Children: N/A  . Years of Education: N/A   Social History Main Topics  . Smoking status: Never Smoker   . Smokeless tobacco: None  . Alcohol Use: Yes  . Drug Use: No  . Sexually Active: None   Other Topics Concern  . None   Social History Narrative  . None   REVIEW OF SYSTEMS - PERTINENT POSITIVES ONLY: 12 point review of systems negative other than HPI and PMH except for nasal congestion, palpitations,  arthritis, headaches and weakness  EXAM: Filed Vitals:   10/02/11 0904  BP: 138/82  Pulse: 84  Temp: 97 F (36.1 C)  Resp: 18   Gen:  No acute distress.  Well nourished and well groomed.   Neurological: Alert and oriented to person, place, and time. Coordination normal. Anxious  Head: Normocephalic and atraumatic.  Eyes: Conjunctivae are normal. Pupils are equal, round, and reactive to light. No scleral icterus.  Neck: Normal range of motion. Neck supple. No tracheal deviation or thyromegaly present.  No cervical lymphadenopathy. Cardiovascular: Normal rate, regular rhythm, normal heart sounds and intact distal pulses.  Exam reveals no gallop and no friction rub.  No murmur heard. Respiratory: Effort normal.  No respiratory distress. No chest wall tenderness. Breath sounds normal.  No wheezes, rales or rhonchi.  GI: Soft. Bowel sounds are normal. The abdomen is soft and nontender.  There is no rebound and no guarding.  Musculoskeletal: Normal range of motion. L shoulder mass at deltoid tendon, tender to palpation, nonmobile ~2-3cm in size.  No palpable L axillary lymphadenopathy Skin: Skin is warm and dry. No rash noted. No diaphoresis. No erythema. No pallor. No clubbing, cyanosis, or   edema.   Psychiatric: Normal mood and affect. Behavior is normal. Judgment and thought content normal.   RADIOLOGY RESULTS:   Images and reports are reviewed MR (08/29/11): 1. Irregular fibrous lesion in the subcutaneous fat along the posterior inferior margin of the deltoid muscle demonstrates enhancement following contrast. Differential considerations include post-traumatic fibrosis, nodular fasciitis and superficial fibromatosis. An early malignant fibrous lesion cannot be excluded. If the patient has not had prior trauma to this area, excisional biopsy of this lesion should be considered. If the lesion is not removed, close clinical follow-up is warranted.  2. Underlying left shoulder glenohumeral  degenerative changes and synovial inflammation.  3. Partial insertional tear of the distal supraspinatus tendon, suboptimally evaluated.  US (08/22/11) 2.5 cm irregular, hypoechoic mass with posterior physical shadowing at the location of the palpable abnormality on the left. This has ultrasound features highly suspicious for malignancy. Further evaluation with pre and postcontrast magnetic resonance imaging of this area is recommended.    ASSESSMENT AND Plan L shoulder mass.  Could not rule out malignancy on MRI.  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  Will get incisional biopsy to confirm.  Discussed with the patient that I will only be getting a biopsy and not removing the mass.  I discussed with her the need for an orthopedic surgeon if this happens to be something more serious.  She was agreeable to this plan and will be scheduled for outpatient surgery.   Hadja Harral C Brentton Wardlow, MD Colon and Rectal Surgery / General Surgery [facility]      Visit Diagnoses: 1. Mass of shoulder region     Primary Care Physician: Amelia Wilson, MD   

## 2011-10-24 NOTE — Anesthesia Preprocedure Evaluation (Addendum)
Anesthesia Evaluation  Patient identified by MRN, date of birth, ID band Patient awake    Reviewed: Allergy & Precautions, H&P , NPO status , Patient's Chart, lab work & pertinent test results  History of Anesthesia Complications Negative for: history of anesthetic complications  Airway Mallampati: I TM Distance: >3 FB Neck ROM: Full    Dental  (+) Teeth Intact and Dental Advisory Given   Pulmonary former smoker,  breath sounds clear to auscultation        Cardiovascular hypertension, Pt. on medications Rhythm:Regular Rate:Normal     Neuro/Psych  Headaches, Anxiety Depression PTSD   GI/Hepatic GERD-  Medicated and Poorly Controlled,  Endo/Other    Renal/GU      Musculoskeletal   Abdominal   Peds  (+) ADHD Hematology   Anesthesia Other Findings   Reproductive/Obstetrics                          Anesthesia Physical Anesthesia Plan  ASA: II  Anesthesia Plan: General   Post-op Pain Management:    Induction: Intravenous  Airway Management Planned: LMA  Additional Equipment:   Intra-op Plan:   Post-operative Plan: Extubation in OR  Informed Consent: I have reviewed the patients History and Physical, chart, labs and discussed the procedure including the risks, benefits and alternatives for the proposed anesthesia with the patient or authorized representative who has indicated his/her understanding and acceptance.     Plan Discussed with: CRNA and Surgeon  Anesthesia Plan Comments:         Anesthesia Quick Evaluation

## 2011-10-24 NOTE — Preoperative (Signed)
Beta Blockers   Reason not to administer Beta Blockers:Not Applicable 

## 2011-10-24 NOTE — Anesthesia Postprocedure Evaluation (Signed)
  Anesthesia Post-op Note  Patient: Julie Carson  Procedure(s) Performed: Procedure(s) (LRB) with comments: EXCISION MASS (Left)  Patient Location: PACU  Anesthesia Type: General  Level of Consciousness: awake  Airway and Oxygen Therapy: Patient Spontanous Breathing  Post-op Pain: mild  Post-op Assessment: Post-op Vital signs reviewed, Patient's Cardiovascular Status Stable, Respiratory Function Stable, Patent Airway, No signs of Nausea or vomiting and Pain level controlled  Post-op Vital Signs: stable  Complications: No apparent anesthesia complications

## 2011-10-24 NOTE — Transfer of Care (Signed)
Immediate Anesthesia Transfer of Care Note  Patient: Julie Carson  Procedure(s) Performed: Procedure(s) (LRB) with comments: EXCISION MASS (Left)  Patient Location: PACU  Anesthesia Type: General  Level of Consciousness: awake, alert  and oriented  Airway & Oxygen Therapy: Patient Spontanous Breathing and Patient connected to nasal cannula oxygen  Post-op Assessment: Report given to PACU RN and Post -op Vital signs reviewed and stable  Post vital signs: Reviewed  Complications: No apparent anesthesia complications

## 2011-10-24 NOTE — Op Note (Signed)
Please see brief op note for full dictation

## 2011-10-24 NOTE — Anesthesia Procedure Notes (Addendum)
Performed by: Lovie Chol   Procedure Name: LMA Insertion Date/Time: 10/24/2011 8:55 AM Performed by: Lovie Chol Pre-anesthesia Checklist: Patient identified, Emergency Drugs available, Suction available, Patient being monitored and Timeout performed Patient Re-evaluated:Patient Re-evaluated prior to inductionOxygen Delivery Method: Circle system utilized Preoxygenation: Pre-oxygenation with 100% oxygen Intubation Type: IV induction Ventilation: Mask ventilation without difficulty LMA: LMA with gastric port inserted LMA Size: 4.0 Number of attempts: 1 Placement Confirmation: positive ETCO2 and breath sounds checked- equal and bilateral Tube secured with: Tape Dental Injury: Teeth and Oropharynx as per pre-operative assessment

## 2011-10-25 ENCOUNTER — Telehealth (INDEPENDENT_AMBULATORY_CARE_PROVIDER_SITE_OTHER): Payer: Self-pay

## 2011-10-25 ENCOUNTER — Encounter (HOSPITAL_COMMUNITY): Payer: Self-pay | Admitting: General Surgery

## 2011-10-25 DIAGNOSIS — G8918 Other acute postprocedural pain: Secondary | ICD-10-CM

## 2011-10-25 MED ORDER — HYDROCODONE-ACETAMINOPHEN 5-325 MG PO TABS: 1.0000 | ORAL_TABLET | ORAL | Status: AC | PRN

## 2011-10-25 NOTE — Telephone Encounter (Signed)
The pt called regarding her pain.  The Percocet is not helping the pain.  She tried it with Aleve.  She has tried ice.  She was going to put on a sling due to the stabbing pain when she moves.  She states she has had Vicodin before and it helped.  I paged Dr Maisie Fus.  She will not prescribe anything stronger but she can try the Vicodin if she wants to.  I called the pt back and informed her.  I told her the Vicodin is less strong but it may work again for her.  She is willing to try it so I called in Hydrocodone 5/325 one tab po Q 4-6 hrs prn pain # 30 with no refills [facility] [phone]

## 2011-10-29 ENCOUNTER — Telehealth (INDEPENDENT_AMBULATORY_CARE_PROVIDER_SITE_OTHER): Payer: Self-pay

## 2011-10-29 NOTE — Telephone Encounter (Signed)
I called and gave the pt her benign pathology results.

## 2011-11-07 ENCOUNTER — Encounter (INDEPENDENT_AMBULATORY_CARE_PROVIDER_SITE_OTHER): Payer: PRIVATE HEALTH INSURANCE | Admitting: General Surgery

## 2011-11-09 ENCOUNTER — Ambulatory Visit (INDEPENDENT_AMBULATORY_CARE_PROVIDER_SITE_OTHER): Payer: PRIVATE HEALTH INSURANCE | Admitting: General Surgery

## 2011-11-09 VITALS — BP 148/98 | HR 100 | Temp 97.2°F | Resp 18 | Ht 61.0 in | Wt 136.0 lb

## 2011-11-09 DIAGNOSIS — D1720 Benign lipomatous neoplasm of skin and subcutaneous tissue of unspecified limb: Secondary | ICD-10-CM

## 2011-11-09 DIAGNOSIS — D1779 Benign lipomatous neoplasm of other sites: Secondary | ICD-10-CM

## 2011-11-09 NOTE — Patient Instructions (Signed)
Continue to exercise your shoulder until you get your entire range of motion back.

## 2011-11-09 NOTE — Progress Notes (Signed)
  Subjective: Patient is s/p incisional biopsy of L shoulder mass.  She c/o pain and a "popping feeling" if she does any strenuous activity with her arm.  She denies any difficulties with the incision.   Objective: Filed Vitals:   11/09/11 1530  BP: 148/98  Pulse: 100  Temp: 97.2 F (36.2 C)  Resp: 18    General appearance: alert, cooperative and no distress  Incision: healing well  Assessment: s/p  Patient Active Problem List  Diagnosis  . DEPRESSION, SITUATIONAL  . DEPRESSION  . Lipoma of arm    Plan: Continue to exercise arm to improve ROM.  Popping feeling is likely due to scar tissue. F/U PRN  .Vanita Panda, MD [facility], [place] [phone]   11/09/2011 3:59 PM

## 2011-12-05 ENCOUNTER — Other Ambulatory Visit (INDEPENDENT_AMBULATORY_CARE_PROVIDER_SITE_OTHER): Payer: Self-pay

## 2012-04-14 ENCOUNTER — Encounter (HOSPITAL_COMMUNITY): Payer: Self-pay

## 2012-04-14 ENCOUNTER — Emergency Department (HOSPITAL_COMMUNITY): Payer: Self-pay

## 2012-04-14 ENCOUNTER — Inpatient Hospital Stay (HOSPITAL_COMMUNITY)
Admission: EM | Admit: 2012-04-14 | Discharge: 2012-04-17 | DRG: 641 | Disposition: A | Discharge status: Home / Self Care | Payer: Self-pay | Attending: Internal Medicine | Admitting: Internal Medicine

## 2012-04-14 DIAGNOSIS — Z7982 Long term (current) use of aspirin: Secondary | ICD-10-CM

## 2012-04-14 DIAGNOSIS — F4310 Post-traumatic stress disorder, unspecified: Secondary | ICD-10-CM | POA: Diagnosis present

## 2012-04-14 DIAGNOSIS — Z23 Encounter for immunization: Secondary | ICD-10-CM

## 2012-04-14 DIAGNOSIS — Z79899 Other long term (current) drug therapy: Secondary | ICD-10-CM

## 2012-04-14 DIAGNOSIS — T502X5A Adverse effect of carbonic-anhydrase inhibitors, benzothiadiazides and other diuretics, initial encounter: Secondary | ICD-10-CM | POA: Diagnosis present

## 2012-04-14 DIAGNOSIS — F909 Attention-deficit hyperactivity disorder, unspecified type: Secondary | ICD-10-CM | POA: Diagnosis present

## 2012-04-14 DIAGNOSIS — F1320 Sedative, hypnotic or anxiolytic dependence, uncomplicated: Secondary | ICD-10-CM | POA: Diagnosis present

## 2012-04-14 DIAGNOSIS — IMO0002 Reserved for concepts with insufficient information to code with codable children: Secondary | ICD-10-CM

## 2012-04-14 DIAGNOSIS — F1910 Other psychoactive substance abuse, uncomplicated: Secondary | ICD-10-CM | POA: Diagnosis present

## 2012-04-14 DIAGNOSIS — Y92009 Unspecified place in unspecified non-institutional (private) residence as the place of occurrence of the external cause: Secondary | ICD-10-CM

## 2012-04-14 DIAGNOSIS — F4321 Adjustment disorder with depressed mood: Secondary | ICD-10-CM | POA: Diagnosis present

## 2012-04-14 DIAGNOSIS — Z85828 Personal history of other malignant neoplasm of skin: Secondary | ICD-10-CM

## 2012-04-14 DIAGNOSIS — F1020 Alcohol dependence, uncomplicated: Secondary | ICD-10-CM | POA: Diagnosis present

## 2012-04-14 DIAGNOSIS — K219 Gastro-esophageal reflux disease without esophagitis: Secondary | ICD-10-CM | POA: Diagnosis present

## 2012-04-14 DIAGNOSIS — M899 Disorder of bone, unspecified: Secondary | ICD-10-CM | POA: Diagnosis present

## 2012-04-14 DIAGNOSIS — F411 Generalized anxiety disorder: Secondary | ICD-10-CM | POA: Diagnosis present

## 2012-04-14 DIAGNOSIS — E876 Hypokalemia: Principal | ICD-10-CM | POA: Diagnosis present

## 2012-04-14 DIAGNOSIS — M129 Arthropathy, unspecified: Secondary | ICD-10-CM | POA: Diagnosis present

## 2012-04-14 LAB — URINALYSIS, ROUTINE W REFLEX MICROSCOPIC
Glucose, UA: 100 mg/dL — AB
Specific Gravity, Urine: 1.015 (ref 1.005–1.030)
pH: 6 (ref 5.0–8.0)

## 2012-04-14 LAB — COMPREHENSIVE METABOLIC PANEL
ALT: 31 U/L (ref 0–35)
AST: 41 U/L — ABNORMAL HIGH (ref 0–37)
Albumin: 3.4 g/dL — ABNORMAL LOW (ref 3.5–5.2)
CO2: 34 mEq/L — ABNORMAL HIGH (ref 19–32)
Calcium: 9.9 mg/dL (ref 8.4–10.5)
Chloride: 86 mEq/L — ABNORMAL LOW (ref 96–112)
Creatinine, Ser: 0.65 mg/dL (ref 0.50–1.10)
Sodium: 132 mEq/L — ABNORMAL LOW (ref 135–145)
Total Bilirubin: 0.3 mg/dL (ref 0.3–1.2)

## 2012-04-14 LAB — CBC WITH DIFFERENTIAL/PLATELET
Basophils Absolute: 0.1 10*3/uL (ref 0.0–0.1)
Basophils Relative: 1 % (ref 0–1)
HCT: 38.8 % (ref 36.0–46.0)
Lymphocytes Relative: 13 % (ref 12–46)
MCHC: 34.5 g/dL (ref 30.0–36.0)
Monocytes Absolute: 1.3 10*3/uL — ABNORMAL HIGH (ref 0.1–1.0)
Neutro Abs: 9 10*3/uL — ABNORMAL HIGH (ref 1.7–7.7)
Platelets: 302 10*3/uL (ref 150–400)
RDW: 12.9 % (ref 11.5–15.5)
WBC: 12.3 10*3/uL — ABNORMAL HIGH (ref 4.0–10.5)

## 2012-04-14 LAB — POCT I-STAT TROPONIN I: Troponin i, poc: 0.01 ng/mL (ref 0.00–0.08)

## 2012-04-14 LAB — RAPID URINE DRUG SCREEN, HOSP PERFORMED
Barbiturates: NOT DETECTED
Benzodiazepines: NOT DETECTED
Cocaine: NOT DETECTED
Tetrahydrocannabinol: NOT DETECTED

## 2012-04-14 LAB — ETHANOL: Alcohol, Ethyl (B): <11 mg/dL (ref 0–11)

## 2012-04-14 LAB — URINE MICROSCOPIC-ADD ON

## 2012-04-14 MED ORDER — CEPHALEXIN 500 MG PO CAPS
500.0000 mg | ORAL_CAPSULE | Freq: Three times a day (TID) | ORAL | Status: DC
  Administered 2012-04-14 – 2012-04-17 (×7): 500 mg via ORAL
  Filled 2012-04-14 (×11): qty 1

## 2012-04-14 MED ORDER — LORAZEPAM 1 MG PO TABS
1.0000 mg | ORAL_TABLET | Freq: Four times a day (QID) | ORAL | Status: DC | PRN
  Administered 2012-04-15 – 2012-04-17 (×6): 1 mg via ORAL
  Filled 2012-04-14 (×7): qty 1

## 2012-04-14 MED ORDER — DEXTROSE-NACL 5-0.9 % IV SOLN
Freq: Once | INTRAVENOUS | Status: AC
  Administered 2012-04-14: 16:00:00 via INTRAVENOUS

## 2012-04-14 MED ORDER — FOLIC ACID 1 MG PO TABS
1.0000 mg | ORAL_TABLET | Freq: Every day | ORAL | Status: DC
  Administered 2012-04-14 – 2012-04-17 (×4): 1 mg via ORAL
  Filled 2012-04-14 (×4): qty 1

## 2012-04-14 MED ORDER — ADULT MULTIVITAMIN W/MINERALS CH
1.0000 | ORAL_TABLET | Freq: Every day | ORAL | Status: DC
  Administered 2012-04-14 – 2012-04-17 (×4): 1 via ORAL
  Filled 2012-04-14 (×4): qty 1

## 2012-04-14 MED ORDER — LORAZEPAM 2 MG/ML IJ SOLN
2.0000 mg | Freq: Once | INTRAMUSCULAR | Status: AC
  Administered 2012-04-14: 2 mg via INTRAVENOUS
  Filled 2012-04-14: qty 1

## 2012-04-14 MED ORDER — THIAMINE HCL 100 MG/ML IJ SOLN
100.0000 mg | Freq: Every day | INTRAMUSCULAR | Status: DC
  Administered 2012-04-14: 100 mg via INTRAVENOUS
  Filled 2012-04-14: qty 1
  Filled 2012-04-14: qty 2
  Filled 2012-04-14: qty 1

## 2012-04-14 MED ORDER — ONDANSETRON HCL 4 MG/2ML IJ SOLN
4.0000 mg | Freq: Once | INTRAMUSCULAR | Status: AC
  Administered 2012-04-14: 4 mg via INTRAVENOUS
  Filled 2012-04-14: qty 2

## 2012-04-14 MED ORDER — LORAZEPAM 2 MG/ML IJ SOLN
1.0000 mg | Freq: Four times a day (QID) | INTRAMUSCULAR | Status: DC | PRN
  Filled 2012-04-14 (×2): qty 1

## 2012-04-14 MED ORDER — SODIUM CHLORIDE 0.9 % IV BOLUS (SEPSIS)
1000.0000 mL | Freq: Once | INTRAVENOUS | Status: AC
  Administered 2012-04-14: 1000 mL via INTRAVENOUS

## 2012-04-14 MED ORDER — POTASSIUM CHLORIDE CRYS ER 20 MEQ PO TBCR
40.0000 meq | EXTENDED_RELEASE_TABLET | Freq: Once | ORAL | Status: AC
  Administered 2012-04-14: 40 meq via ORAL
  Filled 2012-04-14: qty 2

## 2012-04-14 MED ORDER — VITAMIN B-1 100 MG PO TABS
100.0000 mg | ORAL_TABLET | Freq: Every day | ORAL | Status: DC
  Administered 2012-04-15 – 2012-04-17 (×3): 100 mg via ORAL
  Filled 2012-04-14 (×4): qty 1

## 2012-04-14 NOTE — ED Provider Notes (Signed)
7:57 PM telepsych consult report received.  Telepsych states that she is a good candidate for detox admission at this time.  Withdrawal protocols were ordered while awaiting placement.   Ethelda Chick, MD 04/14/12 [phone]

## 2012-04-14 NOTE — ED Notes (Signed)
Patient is requesting detox from aderall, xanax, and alcohol. Patient last drank alcohol this AM and took 1/4 Xanax .5mg . Patient is very anxious and tearful. Patient denies SI/HI. Patient also states she has been having visual hallucinations.

## 2012-04-14 NOTE — ED Notes (Signed)
Patient transported to X-ray 

## 2012-04-14 NOTE — ED Notes (Signed)
Dr. Karma Ganja contacted about patient urine results.

## 2012-04-14 NOTE — BH Assessment (Signed)
Assessment Note   Julie Carson is a 57 y.o. female who presents to [facility] for detox from alcohol, xanax and adderall.  Pt denies SI/HI/Psych.  Pt uses Xanax, 6 pills daily, last use 04/14/12; Alcohol, 1 pint of Vodka, daily, last use was 04/14/12 and Aderall, 2 pills(40mg ) daily, last use was 2 wks ago.  Pt is tearful during assessment, she says that she has been the sole caregiver for her parents for awhile and her father recently died on Christmas Eve 2013 and mother is not thriving, she is currently residing with [facility].  Pt says doesn't want to watch her mother die.  Pt says she started to use in order to cope with parents health problems and the stress of being their caregiver.  Pt has had detox in the past, 10 yrs ago, unable to tell this Clinical research associate the name of the facility.  Pt c/o w/d sxs; "skin crawling', "hearing birds', headache, nausea, body aches, dizzy.  Pt has no current legal charges or court dates pending.    Axis I: Polysub Dep  Axis II: Deferred Axis III:  Past Medical History  Diagnosis Date  . Anxiety   . GERD (gastroesophageal reflux disease)   . PTSD (post-traumatic stress disorder)   . ADHD (attention deficit hyperactivity disorder)   . Migraine   . UTI (urinary tract infection)   . Hypertension   . Allergy   . Lesion of subcutaneous tissue 2013    Deltoid  . Pneumonia   . Animal dander allergy   . Arthritis   . Osteopenia    Axis IV: other psychosocial or environmental problems, problems related to social environment and problems with primary support group Axis V: 51-60 moderate symptoms  Past Medical History:  Past Medical History  Diagnosis Date  . Anxiety   . GERD (gastroesophageal reflux disease)   . PTSD (post-traumatic stress disorder)   . ADHD (attention deficit hyperactivity disorder)   . Migraine   . UTI (urinary tract infection)   . Hypertension   . Allergy   . Lesion of subcutaneous tissue 2013    Deltoid  . Pneumonia   . Animal dander  allergy   . Arthritis   . Osteopenia     Past Surgical History  Procedure Laterality Date  . Breast enhancement surgery    . Liposuction    . Basal cell carcinoma excision      L neck and shoulder  . Nose surgery    . Mass excision  10/24/2011    Procedure: EXCISION MASS;  Surgeon: Romie Levee, MD;  Location: [facility] OR;  Service: General;  Laterality: Left;    Family History: No family history on file.  Social History:  reports that she has been smoking Cigarettes.  She has been smoking about 0.00 packs per day. She has never used smokeless tobacco. She reports that  drinks alcohol. She reports that she does not use illicit drugs.  Additional Social History:  Alcohol / Drug Use Pain Medications: See MAR  Prescriptions: See MAR  Over the Counter: See MAR  History of alcohol / drug use?: Yes Longest period of sobriety (when/how long): Unk  Negative Consequences of Use: Personal relationships Withdrawal Symptoms: Nausea / Vomiting;Tingling (Skin Crawling, Headache, Body Aches, Dizzy, "Hearing Birds") Substance #1 Name of Substance 1: Xanax  1 - Age of First Use: 60 YOF  1 - Amount (size/oz): 6 Pills  1 - Frequency: Daily  1 - Duration: On-going  1 - Last Use / Amount:  (  04/14/12) Substance #2 Name of Substance 2: Alcohol--Vodka  2 - Age of First Use: 12 YOF  2 - Amount (size/oz): 1 Pint  2 - Frequency: Daily  2 - Duration: On-going  2 - Last Use / Amount: 04/14/12 Substance #3 Name of Substance 3: Adderall  3 - Age of First Use: 52 YOF  3 - Amount (size/oz): 2 Pills(40mg )  3 - Frequency: Daily  3 - Duration: On-going  3 - Last Use / Amount: 2 wks ago   CIWA: CIWA-Ar BP: 102/64 mmHg Pulse Rate: 71 Nausea and Vomiting: mild nausea with no vomiting Tactile Disturbances: none Tremor: not visible, but can be felt fingertip to fingertip Auditory Disturbances: very mild harshness or ability to frighten Paroxysmal Sweats: no sweat visible Visual Disturbances: not  present Anxiety: no anxiety, at ease Headache, Fullness in Head: mild Agitation: normal activity Orientation and Clouding of Sensorium: oriented and can do serial additions CIWA-Ar Total: 5 COWS: Clinical Opiate Withdrawal Scale (COWS) Resting Pulse Rate: Pulse Rate 80 or below Sweating: No report of chills or flushing Restlessness: Able to sit still Pupil Size: Pupils pinned or normal size for room light Bone or Joint Aches: Patient reports sever diffuse aching of joints/muscles Runny Nose or Tearing: Not present GI Upset: nausea or loose stool Tremor: No tremor Yawning: No yawning Anxiety or Irritability: None Gooseflesh Skin: Skin is smooth COWS Total Score: 4  Allergies: No Known Allergies  Home Medications:  (Not in a hospital admission)  OB/GYN Status:  No LMP recorded. Patient is postmenopausal.  General Assessment Data Location of Assessment: [facility] Living Arrangements: Parent (Lives with parents; caregiver ) Can pt return to current living arrangement?: Yes Admission Status: Voluntary Is patient capable of signing voluntary admission?: Yes Transfer from: Acute Hospital Referral Source: MD  Education Status Is patient currently in school?: No Current Grade: None  Highest grade of school patient has completed: None  Name of school: None  Contact person: None   Risk to self Suicidal Ideation: No Suicidal Intent: No Is patient at risk for suicide?: No Suicidal Plan?: No Access to Means: No What has been your use of drugs/alcohol within the last 12 months?: Abusing: Xanax, Alcohol, Adderall  Previous Attempts/Gestures: No How many times?: 0 Other Self Harm Risks: None  Triggers for Past Attempts: None known Intentional Self Injurious Behavior: None Family Suicide History: No Recent stressful life event(s): Trauma (Comment) (Father died Christmas 2013; Mother not thriving) Persecutory voices/beliefs?: No Depression: Yes Depression Symptoms: Loss of  interest in usual pleasures;Feeling worthless/self pity;Tearfulness Substance abuse history and/or treatment for substance abuse?: Yes Suicide prevention information given to non-admitted patients: Not applicable  Risk to Others Homicidal Ideation: No Thoughts of Harm to Others: No Current Homicidal Intent: No Current Homicidal Plan: No Access to Homicidal Means: No Identified Victim: None  History of harm to others?: No Assessment of Violence: None Noted Violent Behavior Description: None  Does patient have access to weapons?: No Criminal Charges Pending?: No Does patient have a court date: No  Psychosis Hallucinations: Auditory ("Hearing Birds") Delusions: None noted  Mental Status Report Appear/Hygiene: Disheveled Eye Contact: Good Motor Activity: Unremarkable Speech: Logical/coherent Level of Consciousness: Alert Mood: Depressed;Sad Affect: Depressed;Sad Anxiety Level: None Thought Processes: Coherent;Relevant Judgement: Unimpaired Orientation: Person;Place;Time;Situation Obsessive Compulsive Thoughts/Behaviors: None  Cognitive Functioning Concentration: Normal Memory: Recent Intact;Remote Intact IQ: Average Insight: Fair Impulse Control: Good Appetite: Good Weight Loss: 0 Weight Gain: 0 Sleep: Decreased Total Hours of Sleep: 5 Vegetative Symptoms: None  ADLScreening [facility]  Assessment Services) Patient's cognitive ability adequate to safely complete daily activities?: Yes Patient able to express need for assistance with ADLs?: Yes Independently performs ADLs?: Yes (appropriate for developmental age)  Abuse/Neglect [facility]) Physical Abuse: Denies Verbal Abuse: Denies Sexual Abuse: Denies  Prior Inpatient Therapy Prior Inpatient Therapy: Yes Prior Therapy Dates: 25 Yrs Ago  Prior Therapy Facilty/Provider(s): [facility]  Reason for Treatment: Detox   Prior Outpatient Therapy Prior Outpatient Therapy: No Prior Therapy Dates: None  Prior Therapy  Facilty/Provider(s): None  Reason for Treatment: None   ADL Screening (condition at time of admission) Patient's cognitive ability adequate to safely complete daily activities?: Yes Patient able to express need for assistance with ADLs?: Yes Independently performs ADLs?: Yes (appropriate for developmental age) Weakness of Legs: None Weakness of Arms/Hands: None  Home Assistive Devices/Equipment Home Assistive Devices/Equipment: None  Therapy Consults (therapy consults require a physician order) PT Evaluation Needed: No OT Evalulation Needed: No SLP Evaluation Needed: No Abuse/Neglect Assessment (Assessment to be complete while patient is alone) Physical Abuse: Denies Verbal Abuse: Denies Sexual Abuse: Denies Exploitation of patient/patient's resources: Denies Self-Neglect: Denies Values / Beliefs Cultural Requests During Hospitalization: None Spiritual Requests During Hospitalization: None Consults Spiritual Care Consult Needed: No Social Work Consult Needed: No Merchant navy officer (For Healthcare) Advance Directive: Patient does not have advance directive;Patient would not like information Pre-existing out of facility DNR order (yellow form or pink MOST form): No Nutrition Screen- [facility] Patient's home diet: Regular Have you recently lost weight without trying?: No Have you been eating poorly because of a decreased appetite?: No Malnutrition Screening Tool Score: 0  Additional Information 1:1 In Past 12 Months?: No CIRT Risk: No Elopement Risk: No Does patient have medical clearance?: Yes     Disposition:  Disposition Initial Assessment Completed: Yes Disposition of Patient: Inpatient treatment program;Referred to ([facility]) Type of inpatient treatment program: Adult Patient referred to: [facility]  On Site Evaluation by:   Reviewed with Physician:     Murrell Redden 04/14/2012 11:01 PM

## 2012-04-14 NOTE — ED Provider Notes (Signed)
History     CSN: [id number]  Arrival date & time 04/14/12  1339   First MD Initiated Contact with Patient 04/14/12 1430      Chief Complaint  Patient presents with  . Medical Clearance  . Addiction Problem    (Consider location/radiation/quality/duration/timing/severity/associated sxs/prior treatment) HPI This 57 year old female requests detox for alcohol and benzodiazepine abuse as well as Adderall, she denies IV drug abuse, she states she was at home alone and is oriented to person and place but not to time because she states she has lost track of time over the last several weeks she has been abusing alcohol and drugs and not taking care of her house or herself, she has lost track of time, she thinks she may have had withdrawal seizures off-and-on, she was trying to wean herself off alcohol and benzodiazepines but could not do it herself, she has generalized pain including headache neck pain back pain chest pain abdominal pain and to all 4 extremities 24 hours a day for weeks, she denies shortness of breath, she states she has been vomiting off and on for weeks, she denies any change in speech vision swallowing or understanding or focal or lateralizing weakness or numbness just feels generally weak. She states she was taking care of her parents her father died in 01-Feb-2023 and now her mom is in a nursing home and so for the last 2 months she has been living alone anxious depressed and abusing alcohol and drugs. She denies suicidal ideation or homicidal ideation there is no treatment prior to arrival. Her last detox was about 12 years ago. She has no idea whether she hit her head over the last several weeks or not. Past Medical History  Diagnosis Date  . Anxiety   . GERD (gastroesophageal reflux disease)   . PTSD (post-traumatic stress disorder)   . ADHD (attention deficit hyperactivity disorder)   . Migraine   . UTI (urinary tract infection)   . Hypertension   . Allergy   . Lesion of  subcutaneous tissue 2013    Deltoid  . Pneumonia   . Animal dander allergy   . Arthritis   . Osteopenia    alcoholism Drug abuse  Past Surgical History  Procedure Laterality Date  . Breast enhancement surgery    . Liposuction    . Basal cell carcinoma excision      L neck and shoulder  . Nose surgery    . Mass excision  10/24/2011    Procedure: EXCISION MASS;  Surgeon: Romie Levee, MD;  Location: [facility] OR;  Service: General;  Laterality: Left;    History reviewed. No pertinent family history.  History  Substance Use Topics  . Smoking status: Current Every Day Smoker    Types: Cigarettes  . Smokeless tobacco: Never Used  . Alcohol Use: Yes     Comment: daily alcohol intake    OB History   Grav Para Term Preterm Abortions TAB SAB Ect Mult Living                  Review of Systems 10 Systems reviewed and are negative for acute change except as noted in the HPI. Allergies  Review of patient's allergies indicates no known allergies.  Home Medications   Current Outpatient Rx  Name  Route  Sig  Dispense  Refill  . aspirin EC 81 MG tablet   Oral   Take 81 mg by mouth daily.         Marland Kitchen  cetirizine (ZYRTEC) 10 MG tablet   Oral   Take 10 mg by mouth daily.         . ranitidine (ZANTAC) 150 MG tablet   Oral   Take 150 mg by mouth 2 (two) times daily as needed for heartburn.          . busPIRone (BUSPAR) 10 MG tablet   Oral   Take 1 tablet (10 mg total) by mouth 3 (three) times daily.   90 tablet   0   . chlordiazePOXIDE (LIBRIUM) 5 MG capsule      Please dispense 18 pills - Take 1 pill three times a day for 3 days, then Take 1 pill two times a day for 3 days, then Take 1 pill once a day for 3 days and stop.   18 capsule   0   . folic acid (FOLVITE) 1 MG tablet   Oral   Take 1 tablet (1 mg total) by mouth daily.   30 tablet   0   . lisdexamfetamine (VYVANSE) 40 MG capsule   Oral   Take 1 capsule (40 mg total) by mouth daily.   30 capsule   0   .  Multiple Vitamin (MULTIVITAMIN WITH MINERALS) TABS   Oral   Take 1 tablet by mouth daily.   30 tablet   0   . thiamine 100 MG tablet   Oral   Take 1 tablet (100 mg total) by mouth daily.   30 tablet   0     BP 134/83  Pulse 83  Temp(Src) 98 F (36.7 C) (Oral)  Resp 20  Ht 5\' 2"  (1.575 m)  Wt 136 lb 0.4 oz (61.7 kg)  BMI 24.87 kg/m2  SpO2 96%  Physical Exam  Nursing note and vitals reviewed. Constitutional:  Awake, alert, nontoxic appearance with baseline speech for patient.  HENT:  Head: Atraumatic.  Mouth/Throat: No oropharyngeal exudate.  Eyes: EOM are normal. Pupils are equal, round, and reactive to light. Right eye exhibits no discharge. Left eye exhibits no discharge.  Neck: Neck supple.  Minimal diffuse posterior neck tenderness and minimal diffuse back tenderness  Cardiovascular: Normal rate and regular rhythm.   No murmur heard. Pulmonary/Chest: Effort normal and breath sounds normal. No stridor. No respiratory distress. She has no wheezes. She has no rales. She exhibits tenderness.  Mildly tender entire chest wall  Abdominal: Soft. Bowel sounds are normal. She exhibits no mass. There is tenderness. There is no rebound.  Minimal nonlocalizing diffuse abdominal tenderness  Musculoskeletal: She exhibits tenderness. She exhibits no edema.  Baseline ROM, moves extremities with no obvious new focal weakness. Minimal diffuse tenderness of all 4 extremities.  Lymphadenopathy:    She has no cervical adenopathy.  Neurological: She is alert.  Awake, alert, cooperative and aware of situation oriented to person and place but not time; motor strength 5/5  bilaterally; sensation normal to light touch bilaterally; peripheral visual fields full to confrontation; no facial asymmetry; tongue midline; major cranial nerves appear intact; no pronator drift, normal finger to nose bilaterally  Skin: No rash noted.  Psychiatric:  Anxious depressed tearful denies suicidal or homicidal  ideation, admits to vague hallucinations over the last several weeks    ED Course  Procedures (including critical care time) ECG: Sinus rhythm, left axis deviation, RSR prime pattern in lead V1, artifact present, no acute ischemic changes noted, compared with August 2013 left axis deviation now present  ACT to see; Telepsych requested.  Labs Reviewed  CBC WITH DIFFERENTIAL - Abnormal; Notable for the following:    WBC 12.3 (*)    Neutro Abs 9.0 (*)    Monocytes Absolute 1.3 (*)    All other components within normal limits  COMPREHENSIVE METABOLIC PANEL - Abnormal; Notable for the following:    Sodium 132 (*)    Potassium 3.0 (*)    Chloride 86 (*)    CO2 34 (*)    Glucose, Bld 128 (*)    Albumin 3.4 (*)    AST 41 (*)    All other components within normal limits  URINALYSIS, ROUTINE W REFLEX MICROSCOPIC - Abnormal; Notable for the following:    APPearance CLOUDY (*)    Glucose, UA 100 (*)    Hgb urine dipstick TRACE (*)    Nitrite POSITIVE (*)    Leukocytes, UA LARGE (*)    All other components within normal limits  URINE MICROSCOPIC-ADD ON - Abnormal; Notable for the following:    Squamous Epithelial / LPF FEW (*)    Bacteria, UA MANY (*)    All other components within normal limits  BASIC METABOLIC PANEL - Abnormal; Notable for the following:    Sodium 134 (*)    Potassium 2.6 (*)    Chloride 94 (*)    CO2 35 (*)    Glucose, Bld 152 (*)    GFR calc non Af Amer 89 (*)    All other components within normal limits  POTASSIUM - Abnormal; Notable for the following:    Potassium 3.1 (*)    All other components within normal limits  BASIC METABOLIC PANEL - Abnormal; Notable for the following:    Potassium 2.9 (*)    Glucose, Bld 148 (*)    All other components within normal limits  COMPREHENSIVE METABOLIC PANEL - Abnormal; Notable for the following:    Sodium 134 (*)    Potassium 3.3 (*)    Glucose, Bld 123 (*)    Albumin 2.8 (*)    Total Bilirubin 0.2 (*)    All  other components within normal limits  PHOSPHORUS - Abnormal; Notable for the following:    Phosphorus 1.7 (*)    All other components within normal limits  CBC - Abnormal; Notable for the following:    RBC 3.12 (*)    Hemoglobin 10.3 (*)    HCT 30.9 (*)    All other components within normal limits  COMPREHENSIVE METABOLIC PANEL - Abnormal; Notable for the following:    Potassium 3.2 (*)    Glucose, Bld 122 (*)    Calcium 8.1 (*)    Total Protein 5.7 (*)    Albumin 2.5 (*)    Total Bilirubin 0.2 (*)    All other components within normal limits  GLUCOSE, CAPILLARY - Abnormal; Notable for the following:    Glucose-Capillary 131 (*)    All other components within normal limits  GLUCOSE, CAPILLARY - Abnormal; Notable for the following:    Glucose-Capillary 182 (*)    All other components within normal limits  URINE CULTURE  URINE RAPID DRUG SCREEN (HOSP PERFORMED)  ETHANOL  MAGNESIUM  MAGNESIUM  POTASSIUM  POCT I-STAT TROPONIN I   No results found.   1. Alcoholism   2. Polysubstance abuse   3. Benzodiazepine dependence   4. Anxiety   5. Depression   6. Adjustment disorder with depressed mood   7. Hypokalemia   8. Lipoma of arm   9. DEPRESSION, SITUATIONAL   10. DEPRESSION  MDM  Patient / Family / Caregiver understand and agree with initial ED impression and plan with expectations set for ED visit.        Hurman Horn, MD 04/21/12 [phone]

## 2012-04-15 ENCOUNTER — Encounter (HOSPITAL_COMMUNITY): Payer: Self-pay | Admitting: *Deleted

## 2012-04-15 LAB — MAGNESIUM
Magnesium: 1.7 mg/dL (ref 1.5–2.5)
Magnesium: 1.8 mg/dL (ref 1.5–2.5)

## 2012-04-15 LAB — COMPREHENSIVE METABOLIC PANEL
Alkaline Phosphatase: 90 U/L (ref 39–117)
BUN: 11 mg/dL (ref 6–23)
CO2: 29 mEq/L (ref 19–32)
Chloride: 97 mEq/L (ref 96–112)
GFR calc Af Amer: >90 mL/min (ref >90)
GFR calc non Af Amer: >90 mL/min (ref >90)
Glucose, Bld: 123 mg/dL — ABNORMAL HIGH (ref 70–99)
Potassium: 3.3 mEq/L — ABNORMAL LOW (ref 3.5–5.1)
Total Bilirubin: 0.2 mg/dL — ABNORMAL LOW (ref 0.3–1.2)
Total Protein: 6.5 g/dL (ref 6.0–8.3)

## 2012-04-15 LAB — BASIC METABOLIC PANEL
BUN: 9 mg/dL (ref 6–23)
CO2: 35 mEq/L — ABNORMAL HIGH (ref 19–32)
Calcium: 8.7 mg/dL (ref 8.4–10.5)
Chloride: 94 mEq/L — ABNORMAL LOW (ref 96–112)
Creatinine, Ser: 0.54 mg/dL (ref 0.50–1.10)
GFR calc Af Amer: >90 mL/min (ref >90)
GFR calc Af Amer: >90 mL/min (ref >90)
GFR calc non Af Amer: >90 mL/min (ref >90)
Glucose, Bld: 148 mg/dL — ABNORMAL HIGH (ref 70–99)
Sodium: 134 mEq/L — ABNORMAL LOW (ref 135–145)

## 2012-04-15 MED ORDER — PNEUMOCOCCAL VAC POLYVALENT 25 MCG/0.5ML IJ INJ
0.5000 mL | INJECTION | INTRAMUSCULAR | Status: AC
  Administered 2012-04-17: 0.5 mL via INTRAMUSCULAR
  Filled 2012-04-15 (×2): qty 0.5

## 2012-04-15 MED ORDER — MAGNESIUM SULFATE IN D5W 10-5 MG/ML-% IV SOLN
1.0000 g | Freq: Once | INTRAVENOUS | Status: AC
  Administered 2012-04-15: 1 g via INTRAVENOUS
  Filled 2012-04-15: qty 100

## 2012-04-15 MED ORDER — FAMOTIDINE 20 MG PO TABS
20.0000 mg | ORAL_TABLET | Freq: Two times a day (BID) | ORAL | Status: DC
  Administered 2012-04-15 – 2012-04-17 (×4): 20 mg via ORAL
  Filled 2012-04-15 (×5): qty 1

## 2012-04-15 MED ORDER — ASPIRIN EC 81 MG PO TBEC
81.0000 mg | DELAYED_RELEASE_TABLET | Freq: Every day | ORAL | Status: DC
  Administered 2012-04-15 – 2012-04-17 (×3): 81 mg via ORAL
  Filled 2012-04-15 (×3): qty 1

## 2012-04-15 MED ORDER — MAGNESIUM SULFATE 50 % IJ SOLN: 1.0000 g | Freq: Once | INTRAMUSCULAR | Status: DC

## 2012-04-15 MED ORDER — LORATADINE 10 MG PO TABS
10.0000 mg | ORAL_TABLET | Freq: Every day | ORAL | Status: DC
  Administered 2012-04-15 – 2012-04-17 (×3): 10 mg via ORAL
  Filled 2012-04-15 (×3): qty 1

## 2012-04-15 MED ORDER — POTASSIUM CHLORIDE 10 MEQ/100ML IV SOLN
10.0000 meq | Freq: Once | INTRAVENOUS | Status: AC
  Administered 2012-04-15: 10 meq via INTRAVENOUS
  Filled 2012-04-15: qty 100

## 2012-04-15 MED ORDER — HYDROCHLOROTHIAZIDE 25 MG PO TABS
25.0000 mg | ORAL_TABLET | Freq: Every day | ORAL | Status: DC
Start: 2012-04-15 — End: 2012-04-17
  Administered 2012-04-15 – 2012-04-17 (×3): 25 mg via ORAL
  Filled 2012-04-15 (×3): qty 1

## 2012-04-15 MED ORDER — SODIUM CHLORIDE 0.9 % IJ SOLN
3.0000 mL | Freq: Two times a day (BID) | INTRAMUSCULAR | Status: DC
  Administered 2012-04-16: 3 mL via INTRAVENOUS

## 2012-04-15 MED ORDER — IBUPROFEN 600 MG PO TABS
600.0000 mg | ORAL_TABLET | Freq: Three times a day (TID) | ORAL | Status: DC | PRN
  Administered 2012-04-15: 600 mg via ORAL
  Filled 2012-04-15 (×3): qty 1

## 2012-04-15 MED ORDER — POTASSIUM CHLORIDE 10 MEQ/100ML IV SOLN
10.0000 meq | INTRAVENOUS | Status: AC
  Administered 2012-04-15 (×3): 10 meq via INTRAVENOUS
  Filled 2012-04-15 (×3): qty 100

## 2012-04-15 MED ORDER — POTASSIUM CHLORIDE CRYS ER 20 MEQ PO TBCR
40.0000 meq | EXTENDED_RELEASE_TABLET | Freq: Once | ORAL | Status: AC
  Administered 2012-04-15: 40 meq via ORAL
  Filled 2012-04-15: qty 2

## 2012-04-15 MED ORDER — INFLUENZA VIRUS VACC SPLIT PF IM SUSP
0.5000 mL | INTRAMUSCULAR | Status: AC
  Administered 2012-04-17: 0.5 mL via INTRAMUSCULAR
  Filled 2012-04-15 (×2): qty 0.5

## 2012-04-15 NOTE — ED Notes (Signed)
Ambulated to restroom without assistance 

## 2012-04-15 NOTE — ED Notes (Signed)
Pt belongings moved to LOCKER #[id number]

## 2012-04-15 NOTE — ED Notes (Signed)
Pt asked for phone numbers out of her purse. [place] and I walked her purse into her room and pt retrieved her numbers with [place] at bedside. Purse returned back to belongings bag.

## 2012-04-15 NOTE — ED Provider Notes (Signed)
Called regarding patient's potassium level.  Will plan on IV potassium and magnesium.  Check magnesium level.    Continue to monitor.  Celene Kras, MD 04/15/12 [phone]

## 2012-04-15 NOTE — ED Notes (Signed)
Pt ambulated to bathroom 

## 2012-04-15 NOTE — ED Notes (Signed)
Report was given to [place], RN on 5 east. Pt in route to the floor. All belongings sent with her.

## 2012-04-15 NOTE — H&P (Signed)
[facility] History and Physical  Julie Carson JYN:[id number] DOB: [birth year] DOA: 04/14/2012  Referring physician: ER physician PCP: Cain Saupe, MD   Chief Complaint: hypokalemia  HPI:  57 year old female with past medical history of active alcohol abuse and dependence who presented to ED for detoxification. Patient denied having chest pain, palpitations, shortness of breath, diaphoresis but does report she may have had seizures as she has lost track of time for past few days and has not taken care of her house. In ED, patient was found to have potassium of 3 and has been supplemented for total of 80 meq potassium chloride but her potassium level remained low at now 2.9. Patient has no reports of nausea or vomiting. No reports of diarrhea and no blood in stool or urine. No fever or chills or cough. In ED, CXR was normal and CT head and cervical spine with no acute intracranial findings. As mentioned above her potassium was 3 on admission and with total of 80 meq potassium supplement potassium remained 2.9.  Assessment and Plan:  Principal Problem:   Hypokalemia  Unclear etiology  Check magnesium level  Replete potassium with IV potassium chloride  Active Problems: History of alcohol abuse  Psychiatry informed patient is being admitted to [facility] (spoke with Mellody Life in [facility] who will relay the massage to Psych MD) and they will follow up   Alcohol level WNL on this admission  CIWA protocol ordered  Depression  Per psychiatry  Psych informed of patient's admission to [facility]  Code Status: Full Family Communication: Pt at bedside Disposition Plan: Admit for further evaluation  Manson Passey, MD  [facility] Pager [phone]  If 7PM-7AM, please contact night-coverage [email or web address] Password [email or web address] 04/15/2012, 4:55 PM   Review of Systems:  Constitutional: Negative for fever, chills and malaise/fatigue. Negative for diaphoresis.  HENT: Negative for hearing loss, ear pain,  nosebleeds, congestion, sore throat, neck pain, tinnitus and ear discharge.   Eyes: Negative for blurred vision, double vision, photophobia, pain, discharge and redness.  Respiratory: Negative for cough, hemoptysis, sputum production, shortness of breath, wheezing and stridor.   Cardiovascular: Negative for chest pain, palpitations, orthopnea, claudication and leg swelling.  Gastrointestinal: Negative for nausea, vomiting and abdominal pain. Negative for heartburn, constipation, blood in stool and melena.  Genitourinary: Negative for dysuria, urgency, frequency, hematuria and flank pain.  Musculoskeletal: Negative for myalgias, back pain, joint pain and falls.  Skin: Negative for itching and rash.  Neurological: Negative for dizziness and weakness. Negative for tingling, tremors, sensory change, speech change, focal weakness, loss of consciousness and headaches.  Endo/Heme/Allergies: Negative for environmental allergies and polydipsia. Does not bruise/bleed easily.  Psychiatric/Behavioral: Negative for suicidal ideas. The patient is not nervous/anxious.      Past Medical History  Diagnosis Date  . Anxiety   . GERD (gastroesophageal reflux disease)   . PTSD (post-traumatic stress disorder)   . ADHD (attention deficit hyperactivity disorder)   . Migraine   . UTI (urinary tract infection)   . Hypertension   . Allergy   . Lesion of subcutaneous tissue 2013    Deltoid  . Pneumonia   . Animal dander allergy   . Arthritis   . Osteopenia    Past Surgical History  Procedure Laterality Date  . Breast enhancement surgery    . Liposuction    . Basal cell carcinoma excision      L neck and shoulder  . Nose surgery    . Mass excision  10/24/2011    Procedure:  EXCISION MASS;  Surgeon: Romie Levee, MD;  Location: [facility] OR;  Service: General;  Laterality: Left;   Social History:  reports that she has been smoking Cigarettes.  She has been smoking about 0.00 packs per day. She has never used  smokeless tobacco. She reports that  drinks alcohol. She reports that she does not use illicit drugs.  No Known Allergies  Family History: htn in parents  Prior to Admission medications   Medication Sig Start Date End Date Taking? Authorizing Provider  ALPRAZolam Prudy Feeler) 0.5 MG tablet Take 0.5-1 mg by mouth 3 (three) times daily as needed for anxiety.    Yes Historical Provider, MD  amphetamine-dextroamphetamine (ADDERALL) 20 MG tablet Take 20 mg by mouth 2 (two) times daily.   Yes Historical Provider, MD  aspirin EC 81 MG tablet Take 81 mg by mouth daily.   Yes Historical Provider, MD  cetirizine (ZYRTEC) 10 MG tablet Take 10 mg by mouth daily.   Yes Historical Provider, MD  hydrochlorothiazide (HYDRODIURIL) 25 MG tablet Take 25 mg by mouth daily.   Yes Historical Provider, MD  ranitidine (ZANTAC) 150 MG tablet Take 150 mg by mouth 2 (two) times daily as needed for heartburn.    Yes Historical Provider, MD   Physical Exam: Filed Vitals:   04/14/12 2211 04/15/12 0110 04/15/12 0950 04/15/12 1200  BP: 102/64 113/73 125/93 107/73  Pulse: 71 73 78 71  Temp:   98.7 F (37.1 C) 98.3 F (36.8 C)  TempSrc:   Oral Oral  Resp:   17 18  SpO2:   100% 97%    Physical Exam  Constitutional: Appears well-developed and well-nourished. No distress.  HENT: Normocephalic. External right and left ear normal. Oropharynx is clear and moist.  Eyes: Conjunctivae and EOM are normal. PERRLA, no scleral icterus.  Neck: Normal ROM. Neck supple. No JVD. No tracheal deviation. No thyromegaly.  CVS: RRR, S1/S2 +, no murmurs, no gallops, no carotid bruit.  Pulmonary: Effort and breath sounds normal, no stridor, rhonchi, wheezes, rales.  Abdominal: Soft. BS +,  no distension, tenderness, rebound or guarding.  Musculoskeletal: Normal range of motion. No edema and no tenderness.  Lymphadenopathy: No lymphadenopathy noted, cervical, inguinal. Neuro: Alert. Normal reflexes, muscle tone coordination. No cranial nerve  deficit. Skin: Skin is warm and dry. No rash noted. Not diaphoretic. No erythema. No pallor.    Labs on Admission:  Basic Metabolic Panel:  Recent Labs Lab 04/14/12 1513 04/14/12 2329 04/15/12 0100 04/15/12 0630 04/15/12 1400  NA 132* 134*  --   --  135  K 3.0* 2.6*  --  3.1* 2.9*  CL 86* 94*  --   --  97  CO2 34* 35*  --   --  28  GLUCOSE 128* 152*  --   --  148*  BUN 12 12  --   --  9  CREATININE 0.65 0.79  --   --  0.54  CALCIUM 9.9 8.6  --   --  8.7  MG  --   --  1.7  --   --    Liver Function Tests:  Recent Labs Lab 04/14/12 1513  AST 41*  ALT 31  ALKPHOS 104  BILITOT 0.3  PROT 7.2  ALBUMIN 3.4*   No results found for this basename: LIPASE, AMYLASE,  in the last 168 hours No results found for this basename: AMMONIA,  in the last 168 hours CBC:  Recent Labs Lab 04/14/12 1513  WBC 12.3*  NEUTROABS 9.0*  HGB 13.4  HCT 38.8  MCV 95.3  PLT 302   Cardiac Enzymes: No results found for this basename: CKTOTAL, CKMB, CKMBINDEX, TROPONINI,  in the last 168 hours BNP: No components found with this basename: POCBNP,  CBG: No results found for this basename: GLUCAP,  in the last 168 hours  Radiological Exams on Admission: Dg Chest 2 View 04/14/2012  *  IMPRESSION: No acute cardiopulmonary process.   Original Report Authenticated By: Christiana Pellant, M.D.    Ct Head Wo Contrast 04/14/2012    IMPRESSION: No acute intracranial abnormality.  Atrophy, chronic microvascular disease.  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  The vertebral bodies are smaller than the remainder of the visualized vertebral bodies.  Degenerative disc disease from C4-5 through C6- 7.  Degenerative facet disease bilaterally.  Prevertebral soft tissues and alignment are normal.  No fracture.  No epidural or paraspinal hematoma.  IMPRESSION: Degenerative changes.  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  No acute bony abnormality.        Ct Cervical Spine Wo Contrast 04/14/2012  *  IMPRESSION: No acute intracranial abnormality.  Atrophy, chronic microvascular disease.  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  The vertebral bodies are smaller than the remainder of the visualized vertebral bodies.  Degenerative disc disease from C4-5 through C6- 7.  Degenerative facet disease bilaterally.  Prevertebral soft tissues and alignment are normal.  No fracture.  No epidural or paraspinal hematoma.  IMPRESSION: Degenerative changes.  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  No acute bony abnormality.   Original Report Authenticated By: Charlett Nose, M.D.     EKG: Normal sinus rhythm, no ST/T wave changes  Time spent: 65 minutes

## 2012-04-15 NOTE — Progress Notes (Signed)
[facility] ED CM noted pcp listed as Norberto Sorenson Spoke with pt who confirms pcp is Dr Jillyn Hidden [facility] updated

## 2012-04-15 NOTE — B[facility] Counselor (Signed)
Pt moved to main ED for observation and tx, due to declining K level and magnesium level.  When therapeutic, info can be re-sent to [facility] for review. [facility] requires K level to be 3.2-5.0 and patients level is 3.1 at this time. Dr. Rubin Payor will continue to provide supplements to raise levels. Once level reaches 3.2 will fax info to [facility] for review.

## 2012-04-15 NOTE — ED Provider Notes (Addendum)
  Physical Exam  BP 113/73  Pulse 73  Temp(Src) 98.7 F (37.1 C) (Oral)  Resp 18  SpO2 96%  Physical Exam  ED Course  Procedures  MDM Patient requesting detox off of Adderall Xanax and alcohol. Found to be hypokalemic. She's had oral supplementation and IV supplementation. Her potassium is now 3.1. She will continue to require oral supplementation but likely does not need more IV supplementation. [facility] will reevaluate.      Juliet Rude. Rubin Payor, MD 04/15/12 0818  [facility] requires a K of 3.2 or greater. Will given another run of IV and 40 meq oral and will recheck  Harrold Donath R. Rubin Payor, MD 04/15/12 [phone]

## 2012-04-15 NOTE — BHH Counselor (Signed)
Consulted with EDP-Dr. Rubin Payor for guidance regarding patients potassium level. This writer is unable to seek placement until potassium level is 3.2 or higher. Potassium level has went from 3.1 to 2.9. Dr. Rubin Payor will contact medicine for a medical admit.

## 2012-04-16 LAB — CBC
MCH: 33 pg (ref 26.0–34.0)
MCHC: 33.3 g/dL (ref 30.0–36.0)
Platelets: 237 10*3/uL (ref 150–400)
RDW: 13.9 % (ref 11.5–15.5)

## 2012-04-16 LAB — COMPREHENSIVE METABOLIC PANEL
ALT: 26 U/L (ref 0–35)
AST: 29 U/L (ref 0–37)
Calcium: 8.1 mg/dL — ABNORMAL LOW (ref 8.4–10.5)
Creatinine, Ser: 0.54 mg/dL (ref 0.50–1.10)
Sodium: 139 mEq/L (ref 135–145)
Total Protein: 5.7 g/dL — ABNORMAL LOW (ref 6.0–8.3)

## 2012-04-16 LAB — GLUCOSE, CAPILLARY

## 2012-04-16 LAB — URINE CULTURE

## 2012-04-16 MED ORDER — POTASSIUM CHLORIDE CRYS ER 20 MEQ PO TBCR
40.0000 meq | EXTENDED_RELEASE_TABLET | Freq: Two times a day (BID) | ORAL | Status: AC
  Administered 2012-04-16 (×2): 40 meq via ORAL
  Filled 2012-04-16 (×2): qty 2

## 2012-04-16 MED ORDER — CHLORDIAZEPOXIDE HCL 10 MG PO CAPS
10.0000 mg | ORAL_CAPSULE | Freq: Three times a day (TID) | ORAL | Status: DC
  Administered 2012-04-16 – 2012-04-17 (×4): 10 mg via ORAL
  Filled 2012-04-16 (×4): qty 1

## 2012-04-16 MED ORDER — HEPARIN SODIUM (PORCINE) 5000 UNIT/ML IJ SOLN
5000.0000 [IU] | Freq: Three times a day (TID) | INTRAMUSCULAR | Status: DC
  Administered 2012-04-16 – 2012-04-17 (×3): 5000 [IU] via SUBCUTANEOUS
  Filled 2012-04-16 (×6): qty 1

## 2012-04-16 MED ORDER — MAGNESIUM SULFATE IN D5W 10-5 MG/ML-% IV SOLN
1.0000 g | Freq: Once | INTRAVENOUS | Status: AC
  Administered 2012-04-16: 1 g via INTRAVENOUS
  Filled 2012-04-16: qty 100

## 2012-04-16 MED ORDER — LISDEXAMFETAMINE DIMESYLATE 20 MG PO CAPS
40.0000 mg | ORAL_CAPSULE | Freq: Every day | ORAL | Status: DC
  Administered 2012-04-17: 40 mg via ORAL
  Filled 2012-04-16 (×2): qty 1
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
## 2012-04-16 NOTE — Progress Notes (Signed)
UR completed 

## 2012-04-16 NOTE — Progress Notes (Signed)
[facility]                                                                                Patient Demographics  Julie Carson, is a 57 y.o. female, DOB - Jan 15, 1956, UJW:[id number], FAO:[id number]  Admit date - 04/14/2012  Admitting Physician Alison Murray, MD  Outpatient Primary MD for the patient is FULP, CAMMIE, MD  LOS - 2   Chief Complaint  Patient presents with  . Medical Clearance  . Addiction Problem        Assessment & Plan    1. 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    2. Hypokalemia with borderline magnesium. Both will be replaced and potassium will be checked in the morning.   Code Status: Full  Family Communication: Patient  Disposition Plan: tbd   Procedures     Consults  psych   DVT Prophylaxis   Heparin   Lab Results  Component Value Date   PLT 237 04/16/2012    Medications  Scheduled Meds: . aspirin EC  81 mg Oral Daily  . cephALEXin  500 mg Oral Q8H  . chlordiazePOXIDE  10 mg Oral TID  . famotidine  20 mg Oral BID  . folic acid  1 mg Oral Daily  . hydrochlorothiazide  25 mg Oral Daily  . influenza  inactive virus vaccine  0.5 mL Intramuscular Tomorrow-1000  . loratadine  10 mg Oral Daily  . multivitamin with minerals  1 tablet Oral Daily  . pneumococcal 23 valent vaccine  0.5 mL Intramuscular Tomorrow-1000  . potassium chloride  40 mEq Oral BID  . sodium chloride  3 mL Intravenous Q12H  . thiamine  100 mg Oral Daily   Or  . thiamine  100 mg Intravenous Daily   Continuous Infusions:  PRN Meds:.ibuprofen, LORazepam, LORazepam  Antibiotics     Anti-infectives   Start     Dose/Rate Route Frequency Ordered Stop   04/14/12 2230  cephALEXin  (KEFLEX) capsule 500 mg     500 mg Oral 3 times per day 04/14/12 2222         Time Spent in minutes 35   Virgle Arth K M.D on 04/16/2012 at 11:32 AM  Between 7am to 7pm - Pager - [phone]  After 7pm go to [email or web address] - password [email or web address]  And look for the night coverage person covering for me after hours  [facility] Office  [phone]    Subjective:   [name] today has, No headache, No chest pain, No abdominal pain - No Nausea, No new weakness tingling or numbness, No Cough - SOB.   Objective:   Filed Vitals:   04/15/12 1851 04/15/12 1959 04/15/12 2110 04/16/12 0527  BP: 121/80  120/78 107/75  Pulse: 76  75 65  Temp: 99.1 F (37.3 C)  99.7 F (37.6 C) 99.8 F (37.7 C)  TempSrc: Oral  Oral Oral  Resp: 18  18 18  Height:  5\' 2"  (1.575 m)    Weight:  61.68 kg (135 lb 15.7 oz)  61.7 kg (136 lb 0.4 oz)  SpO2:   97% 98%    Wt Readings from Last 3 Encounters:  04/16/12 61.7 kg (136 lb 0.4 oz)  11/09/11 61.689 kg (136 lb)  10/17/11 63.05 kg (139 lb)     Intake/Output Summary (Last 24 hours) at 04/16/12 1132 Last data filed at 04/16/12 1100  Gross per 24 hour  Intake    240 ml  Output    700 ml  Net   -460 ml    Exam Awake Alert, Oriented X 3, No new F.N deficits, Normal affect [place].AT,PERRAL Supple Neck,No JVD, No cervical lymphadenopathy appriciated.  Symmetrical Chest wall movement, Good air movement bilaterally, CTAB RRR,No Gallops,Rubs or new Murmurs, No Parasternal Heave +ve B.Sounds, Abd Soft, Non tender, No organomegaly appriciated, No rebound - guarding or rigidity. No Cyanosis, Clubbing or edema, No new Rash or bruise    Data Review   Micro Results Recent Results (from the past 240 hour(s))  URINE CULTURE     Status: None   Collection Time    04/14/12  6:19 PM      Result Value Range Status   Specimen Description URINE, CLEAN CATCH   Final   Special Requests NONE   Final   Culture  Setup Time 04/14/2012 23:27   Final    Colony Count >=100,000 COLONIES/ML   Final   Culture ESCHERICHIA COLI   Final   Report Status 04/16/2012 FINAL   Final   Organism ID, Bacteria ESCHERICHIA COLI   Final    Radiology Reports Dg Chest 2 View  04/14/2012  *RADIOLOGY REPORT*  Clinical Data: Myalgia, nausea, vomiting, medical clearance  CHEST - 2 VIEW  Comparison: 10/17/2011, chest MRI 08/30/2011  Findings: The lungs are clear.  Cardiomediastinal silhouette is within normal limits. The lungs are clear. No pleural effusion.  No pneumothorax.  No acute osseous abnormality. Soft tissue density over the bilateral lung bases corresponds to a previously seen breast implants.  IMPRESSION: No acute cardiopulmonary process.   Original Report Authenticated By: Christiana Pellant, M.D.    Ct Head Wo Contrast  04/14/2012  *RADIOLOGY REPORT*  Clinical Data:  Medical clearance for addiction.  CT HEAD WITHOUT CONTRAST CT CERVICAL SPINE WITHOUT CONTRAST  Technique:  Multidetector CT imaging of the head and cervical spine was performed following the standard protocol without intravenous contrast.  Multiplanar CT image reconstructions of the cervical spine were also generated.  Comparison: 04/14/2012  Findings: There is atrophy and chronic small vessel disease changes. No acute intracranial abnormality.  Specifically, no hemorrhage, hydrocephalus, mass lesion, acute infarction, or significant intracranial injury.  No acute calvarial abnormality. Visualized paranasal sinuses and mastoids clear.  Orbital soft tissues unremarkable.  IMPRESSION: No acute intracranial abnormality.  Atrophy, chronic microvascular disease.  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  The vertebral bodies are smaller than the remainder of the visualized vertebral bodies.  Degenerative disc disease from C4-5 through C6- 7.  Degenerative facet disease bilaterally.  Prevertebral soft tissues and alignment are normal.  No fracture.  No epidural or  paraspinal hematoma.  IMPRESSION: Degenerative changes.  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  No acute bony abnormality.   Original Report Authenticated By: Charlett Nose, M.D.    Ct Cervical Spine Wo Contrast  04/14/2012  *RADIOLOGY REPORT*  Clinical Data:  Medical clearance  for addiction.  CT HEAD WITHOUT CONTRAST CT CERVICAL SPINE WITHOUT CONTRAST  Technique:  Multidetector CT imaging of the head and cervical spine was performed following the standard protocol without intravenous contrast.  Multiplanar CT image reconstructions of the cervical spine were also generated.  Comparison: 04/14/2012  Findings: There is atrophy and chronic small vessel disease changes. No acute intracranial abnormality.  Specifically, no hemorrhage, hydrocephalus, mass lesion, acute infarction, or significant intracranial injury.  No acute calvarial abnormality. Visualized paranasal sinuses and mastoids clear.  Orbital soft tissues unremarkable.  IMPRESSION: No acute intracranial abnormality.  Atrophy, chronic microvascular disease.  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  The vertebral bodies are smaller than the remainder of the visualized vertebral bodies.  Degenerative disc disease from C4-5 through C6- 7.  Degenerative facet disease bilaterally.  Prevertebral soft tissues and alignment are normal.  No fracture.  No epidural or paraspinal hematoma.  IMPRESSION: Degenerative changes.  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  No acute bony abnormality.   Original Report Authenticated By: Charlett Nose, M.D.     CBC  Recent Labs Lab 04/14/12 1513 04/16/12 0515  WBC 12.3* 6.3  HGB 13.4 10.3*  HCT 38.8 30.9*  PLT 302 237  MCV 95.3 99.0  MCH 32.9 33.0  MCHC 34.5 33.3  RDW 12.9 13.9  LYMPHSABS 1.6  --   MONOABS 1.3*  --   EOSABS 0.2  --   BASOSABS 0.1  --     Chemistries   Recent Labs Lab 04/14/12 1513 04/14/12 2329  04/15/12 0100 04/15/12 0630 04/15/12 1400 04/15/12 1913 04/16/12 0515  NA 132* 134*  --   --  135 134* 139  K 3.0* 2.6*  --  3.1* 2.9* 3.3* 3.2*  CL 86* 94*  --   --  97 97 102  CO2 34* 35*  --   --  28 29 28   GLUCOSE 128* 152*  --   --  148* 123* 122*  BUN 12 12  --   --  9 11 9   CREATININE 0.65 0.79  --   --  0.54 0.71 0.54  CALCIUM 9.9 8.6  --   --  8.7 8.6 8.1*  MG  --   --  1.7  --   --  1.8  --   AST 41*  --   --   --   --  37 29  ALT 31  --   --   --   --  30 26  ALKPHOS 104  --   --   --   --  90 77  BILITOT 0.3  --   --   --   --  0.2* 0.2*   ------------------------------------------------------------------------------------------------------------------ estimated creatinine clearance is 65.4 ml/min (by C-G formula based on Cr of 0.54). ------------------------------------------------------------------------------------------------------------------ No results found for this basename: HGBA1C,  in the last 72 hours ------------------------------------------------------------------------------------------------------------------ No results found for this basename: CHOL, HDL, LDLCALC, TRIG, CHOLHDL, LDLDIRECT,  in the last 72 hours ------------------------------------------------------------------------------------------------------------------ No results found for this basename: TSH, T4TOTAL, FREET3, T3FREE, THYROIDAB,  in the last 72 hours ------------------------------------------------------------------------------------------------------------------ No results found for this basename: VITAMINB12, FOLATE, FERRITIN, TIBC, IRON, RETICCTPCT,  in the last 72 hours  Coagulation profile No results found for this basename: INR, PROTIME,  in the last 168 hours  No results found for this basename: DDIMER,  in the last 72 hours  Cardiac Enzymes No results found for this basename: CK, CKMB,  TROPONINI, MYOGLOBIN,  in the last 168  hours ------------------------------------------------------------------------------------------------------------------ No components found with this basename: POCBNP,

## 2012-04-16 NOTE — Progress Notes (Signed)
CSW aware of referral for substance abuse. CSW to complete psychosocial assesment.   Catha Gosselin, LCSWA  [phone] 04/16/2012. 9:17pm

## 2012-04-16 NOTE — Progress Notes (Signed)
Nutrition Brief Note  Patient identified on the Malnutrition Screening Tool (MST) Report  Body mass index is 24.87 kg/(m^2). Patient meets criteria for normal weight based on current BMI.   Wt Readings from Last 10 Encounters:  04/16/12 136 lb 0.4 oz (61.7 kg)  11/09/11 136 lb (61.689 kg)  10/17/11 139 lb (63.05 kg)  10/02/11 137 lb (62.143 kg)    Current diet order is regular, patient is consuming approximately 75% of meals at this time per pt report. Labs and medications reviewed. Pt with slightly low potassium which is getting replaced by IV and PO potassium chloride. Pt reports drinking 1 pint of vodka/day PTA and being in withdrawal for the past 2 months, unsure of how many meals/day she was eating. Pt getting folic acid, thiamine, and daily multivitamin. Pt unsure of any changes in weight. Pt reports appetite is improved since admission and that she ate 75% of lunch tray. Pt denies any nausea.   Pt eating well. No nutrition interventions warranted at this time. If nutrition issues arise, please consult RD.   Levon Hedger MS, RD, LDN [phone] Pager [phone] After Hours Pager

## 2012-04-17 LAB — POTASSIUM: Potassium: 4 mEq/L (ref 3.5–5.1)

## 2012-04-17 MED ORDER — ADULT MULTIVITAMIN W/MINERALS CH: 1.0000 | ORAL_TABLET | Freq: Every day | ORAL | Status: DC

## 2012-04-17 MED ORDER — THIAMINE HCL 100 MG PO TABS: 100.0000 mg | ORAL_TABLET | Freq: Every day | ORAL | Status: DC

## 2012-04-17 MED ORDER — CHLORDIAZEPOXIDE HCL 5 MG PO CAPS: ORAL_CAPSULE | ORAL | Status: DC

## 2012-04-17 MED ORDER — FOLIC ACID 1 MG PO TABS: 1.0000 mg | ORAL_TABLET | Freq: Every day | ORAL | Status: DC

## 2012-04-17 MED ORDER — BUSPIRONE HCL 10 MG PO TABS: 10.0000 mg | ORAL_TABLET | Freq: Three times a day (TID) | ORAL | Status: DC

## 2012-04-17 MED ORDER — LISDEXAMFETAMINE DIMESYLATE 40 MG PO CAPS: 40.0000 mg | ORAL_CAPSULE | Freq: Every day | ORAL | Status: DC

## 2012-04-17 MED ORDER — BUSPIRONE HCL 10 MG PO TABS
10.0000 mg | ORAL_TABLET | Freq: Three times a day (TID) | ORAL | Status: DC
  Administered 2012-04-17: 10 mg via ORAL
  Filled 2012-04-17 (×3): qty 1

## 2012-04-17 NOTE — Discharge Summary (Signed)
[facility]                                                                                   Julie Carson, is a 57 y.o. female  DOB Jun 24, 1955  MRN [id number].  Admission date:  04/14/2012  Discharge Date:  04/17/2012  Primary MD  Cain Saupe, MD  Admitting Physician  Alison Murray, MD  Admission Diagnosis  Adjustment disorder with depressed mood [309.0] Hypokalemia [276.8] Alcoholism [303.90] Anxiety [300.00] Depression [311] Polysubstance abuse [305.90] Benzodiazepine dependence [304.10]  Discharge Diagnosis     Principal Problem:   Hypokalemia Alcohol abuse  Past Medical History  Diagnosis Date  . Anxiety   . GERD (gastroesophageal reflux disease)   . PTSD (post-traumatic stress disorder)   . ADHD (attention deficit hyperactivity disorder)   . Migraine   . UTI (urinary tract infection)   . Hypertension   . Allergy   . Lesion of subcutaneous tissue 2013    Deltoid  . Pneumonia   . Animal dander allergy   . Arthritis   . Osteopenia     Past Surgical History  Procedure Laterality Date  . Breast enhancement surgery    . Liposuction    . Basal cell carcinoma excision      L neck and shoulder  . Nose surgery    . Mass excision  10/24/2011    Procedure: EXCISION MASS;  Surgeon: Romie Levee, MD;  Location: [facility] OR;  Service: General;  Laterality: Left;     Recommendations for primary care physician for things to follow:   Monitor alcohol abuse and prescription drug abuse   Discharge Diagnoses:   Principal Problem:   Hypokalemia    Discharge Condition: stable   Diet recommendation: See Discharge Instructions below   Consults psychiatry, social work    History of present illness and  Hospital Course:     Kindly see H&P for history of present illness and admission details, please review complete Labs, Consult reports and Test reports for all details in brief Julie Carson, is a 57 y.o. female, patient was admitted for voluntary  alcohol detox, she had reportedly been drinking heavy amounts of alcohol over the last 5 years and quit drinking alcohol abruptly about 6 days ago from today, patient also admitted he was using Adderall and Xanax on a regular basis and PCP her stop giving her these medicines as there was concern for overuse , she underwent CT scan of the head and C-spine which were unremarkable, she was kept here on CIWA protocol with thiamine and folic acid supplementation, no evidence of DTs whatsoever. Was seen by psychiatry under their guidance patient was placed on Buspar and Vyvanse .   She will be discharged home on slow to Librium taper , counseled to continue abstaining from alcohol and overusing prescription medications , he will be seen by social work thereafter discharged home with outpatient followup with PCP .    She had hypokalemia upon admission due to HCTZ, potassium is stable after the placement, HCTZ has been stopped.     Today   Subjective:   Julie Carson today has no headache,no chest abdominal pain,no new weakness  tingling or numbness, feels much better wants to go home today.   Objective:   Blood pressure 134/83, pulse 83, temperature 98 F (36.7 C), temperature source Oral, resp. rate 20, height 5\' 2"  (1.575 m), weight 61.7 kg (136 lb 0.4 oz), SpO2 96.00%.   Intake/Output Summary (Last 24 hours) at 04/17/12 0947 Last data filed at 04/17/12 1610  Gross per 24 hour  Intake    480 ml  Output   2100 ml  Net  -1620 ml    Exam Awake Alert, Oriented *3, No new F.N deficits, Normal affect [place].AT,PERRAL Supple Neck,No JVD, No cervical lymphadenopathy appriciated.  Symmetrical Chest wall movement, Good air movement bilaterally, CTAB RRR,No Gallops,Rubs or new Murmurs, No Parasternal Heave +ve B.Sounds, Abd Soft, Non tender, No organomegaly appriciated, No rebound -guarding or rigidity. No Cyanosis, Clubbing or edema, No new Rash or bruise  Data Review   Major procedures and  Radiology Reports - PLEASE review detailed and final reports for all details in brief -       Dg Chest 2 View  04/14/2012  *RADIOLOGY REPORT*  Clinical Data: Myalgia, nausea, vomiting, medical clearance  CHEST - 2 VIEW  Comparison: 10/17/2011, chest MRI 08/30/2011  Findings: The lungs are clear.  Cardiomediastinal silhouette is within normal limits. The lungs are clear. No pleural effusion.  No pneumothorax.  No acute osseous abnormality. Soft tissue density over the bilateral lung bases corresponds to a previously seen breast implants.  IMPRESSION: No acute cardiopulmonary process.   Original Report Authenticated By: Christiana Pellant, M.D.    Ct Head Wo Contrast  04/14/2012  *RADIOLOGY REPORT*  Clinical Data:  Medical clearance for addiction.  CT HEAD WITHOUT CONTRAST CT CERVICAL SPINE WITHOUT CONTRAST  Technique:  Multidetector CT imaging of the head and cervical spine was performed following the standard protocol without intravenous contrast.  Multiplanar CT image reconstructions of the cervical spine were also generated.  Comparison: 04/14/2012  Findings: There is atrophy and chronic small vessel disease changes. No acute intracranial abnormality.  Specifically, no hemorrhage, hydrocephalus, mass lesion, acute infarction, or significant intracranial injury.  No acute calvarial abnormality. Visualized paranasal sinuses and mastoids clear.  Orbital soft tissues unremarkable.  IMPRESSION: No acute intracranial abnormality.  Atrophy, chronic microvascular disease.  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  The vertebral bodies are smaller than the remainder of the visualized vertebral bodies.  Degenerative disc disease from C4-5 through C6- 7.  Degenerative facet disease bilaterally.  Prevertebral soft tissues and alignment are normal.  No fracture.  No epidural or paraspinal hematoma.  IMPRESSION: Degenerative changes.  Probable partial congenital fusion across the  C5-6 disc space.  No acute bony abnormality.   Original Report Authenticated By: Charlett Nose, M.D.    Ct Cervical Spine Wo Contrast  04/14/2012  *RADIOLOGY REPORT*  Clinical Data:  Medical clearance for addiction.  CT HEAD WITHOUT CONTRAST CT CERVICAL SPINE WITHOUT CONTRAST  Technique:  Multidetector CT imaging of the head and cervical spine was performed following the standard protocol without intravenous contrast.  Multiplanar CT image reconstructions of the cervical spine were also generated.  Comparison: 04/14/2012  Findings: There is atrophy and chronic small vessel disease changes. No acute intracranial abnormality.  Specifically, no hemorrhage, hydrocephalus, mass lesion, acute infarction, or significant intracranial injury.  No acute calvarial abnormality. Visualized paranasal sinuses and mastoids clear.  Orbital soft tissues unremarkable.  IMPRESSION: No acute intracranial abnormality.  Atrophy, chronic microvascular disease.  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  The vertebral bodies are smaller than the remainder of the visualized vertebral bodies.  Degenerative disc disease from C4-5 through C6- 7.  Degenerative facet disease bilaterally.  Prevertebral soft tissues and alignment are normal.  No fracture.  No epidural or paraspinal hematoma.  IMPRESSION: Degenerative changes.  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  No acute bony abnormality.   Original Report Authenticated By: Charlett Nose, M.D.     Micro Results      Recent Results (from the past 240 hour(s))  URINE CULTURE     Status: None   Collection Time    04/14/12  6:19 PM      Result Value Range Status   Specimen Description URINE, CLEAN CATCH   Final   Special Requests NONE   Final   Culture  Setup Time 04/14/2012 23:27   Final   Colony Count >=100,000 COLONIES/ML   Final   Culture ESCHERICHIA COLI   Final   Report Status 04/16/2012 FINAL   Final   Organism  ID, Bacteria ESCHERICHIA COLI   Final     CBC w Diff: Lab Results  Component Value Date   WBC 6.3 04/16/2012   HGB 10.3* 04/16/2012   HCT 30.9* 04/16/2012   PLT 237 04/16/2012   LYMPHOPCT 13 04/14/2012   MONOPCT 11 04/14/2012   EOSPCT 2 04/14/2012   BASOPCT 1 04/14/2012    CMP: Lab Results  Component Value Date   NA 139 04/16/2012   K 4.0 04/17/2012   CL 102 04/16/2012   CO2 28 04/16/2012   BUN 9 04/16/2012   CREATININE 0.54 04/16/2012   PROT 5.7* 04/16/2012   ALBUMIN 2.5* 04/16/2012   BILITOT 0.2* 04/16/2012   ALKPHOS 77 04/16/2012   AST 29 04/16/2012   ALT 26 04/16/2012  .   Discharge Instructions     Follow with Primary MD FULP, CAMMIE, MD in 3 days   Get CBC, CMP, checked 3 days by Primary MD and again as instructed by your Primary MD.    Get Medicines reviewed and adjusted.  Please request your Prim.MD to go over all Hospital Tests and Procedure/Radiological results at the follow up, please get all Hospital records sent to your Prim MD by signing hospital release before you go home.  Activity: As tolerated with Full fall precautions use walker/cane & assistance as needed   Diet:  Heart Healthy  For Heart failure patients - Check your Weight same time everyday, if you gain over 2 pounds, or you develop in leg swelling, experience more shortness of breath or chest pain, call your Primary MD immediately. Follow Cardiac Low Salt Diet and 1.8 lit/day fluid restriction.  Disposition Home   If you experience worsening of your admission symptoms, develop shortness of breath, life threatening emergency, suicidal or homicidal thoughts you must seek medical attention immediately by calling 911 or calling your MD immediately  if symptoms less severe.  You Must read complete instructions/literature along with all the possible adverse reactions/side effects for all the Medicines you take and that have been prescribed to you. Take any new Medicines after you have completely understood and  accpet all the possible adverse reactions/side effects.   Do not drive and provide baby sitting services if your were admitted for syncope or siezures until you have seen by Primary MD or a Neurologist and advised to do so again.  Do not drive when taking Pain medications.  Do not take more than prescribed Pain, Sleep and Anxiety Medications  Special Instructions: If you have smoked or chewed Tobacco  in the last 2 yrs please stop smoking, stop any regular Alcohol  and or any Recreational drug use.  Wear Seat belts while driving.       Follow-up Information   Follow up with FULP, CAMMIE, MD In 3 days. (and psychiatrist of choice)    Contact information:   [address] [place], [place] [address] [place] [address] [phone]         Discharge Medications     Medication List    STOP taking these medications       ALPRAZolam 0.5 MG tablet  Commonly known as:  XANAX     amphetamine-dextroamphetamine 20 MG tablet  Commonly known as:  ADDERALL     hydrochlorothiazide 25 MG tablet  Commonly known as:  HYDRODIURIL      TAKE these medications       aspirin EC 81 MG tablet  Take 81 mg by mouth daily.     busPIRone 10 MG tablet  Commonly known as:  BUSPAR  Take 1 tablet (10 mg total) by mouth 3 (three) times daily.     cetirizine 10 MG tablet  Commonly known as:  ZYRTEC  Take 10 mg by mouth daily.     chlordiazePOXIDE 5 MG capsule  Commonly known as:  LIBRIUM  Please dispense 18 pills - Take 1 pill three times a day for 3 days, then Take 1 pill two times a day for 3 days, then Take 1 pill once a day for 3 days and stop.     folic acid 1 MG tablet  Commonly known as:  FOLVITE  Take 1 tablet (1 mg total) by mouth daily.     lisdexamfetamine 40 MG capsule  Commonly known as:  VYVANSE  Take 1 capsule (40 mg total) by mouth daily.     multivitamin with minerals Tabs  Take 1 tablet by mouth daily.     ranitidine 150 MG tablet  Commonly known as:  ZANTAC  Take 150  mg by mouth 2 (two) times daily as needed for heartburn.     thiamine 100 MG tablet  Take 1 tablet (100 mg total) by mouth daily.           Total Time in preparing paper work, data evaluation and todays exam - 35 minutes  Leroy Sea M.D on 04/17/2012 at 9:47 AM  [facility] Office  [phone]
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
## 2012-04-18 LAB — GLUCOSE, CAPILLARY

## 2012-08-21 ENCOUNTER — Telehealth: Payer: Self-pay | Admitting: Family Medicine

## 2012-08-21 NOTE — Telephone Encounter (Signed)
Pt.notified

## 2012-08-21 NOTE — Telephone Encounter (Signed)
Sorry but I am not taking new patients.

## 2012-08-21 NOTE — Telephone Encounter (Signed)
PT is requesting to be seen by you for a new patient visit. She stated that you have taken are of both her father and mother for years. May I schedule this?

## 2012-12-25 ENCOUNTER — Other Ambulatory Visit: Payer: Self-pay

## 2013-02-27 ENCOUNTER — Inpatient Hospital Stay (HOSPITAL_COMMUNITY)
Admission: EM | Admit: 2013-02-27 | Discharge: 2013-03-09 | DRG: 438 | Disposition: A | Discharge status: Home / Self Care | Payer: Self-pay | Attending: Internal Medicine | Admitting: Internal Medicine

## 2013-02-27 ENCOUNTER — Emergency Department (HOSPITAL_COMMUNITY): Payer: Self-pay

## 2013-02-27 ENCOUNTER — Encounter (HOSPITAL_COMMUNITY)
Admission: EM | Disposition: A | Discharge status: Home / Self Care | Payer: Self-pay | Source: Home / Self Care | Attending: Internal Medicine

## 2013-02-27 ENCOUNTER — Encounter (HOSPITAL_COMMUNITY): Payer: Self-pay

## 2013-02-27 DIAGNOSIS — K208 Other esophagitis without bleeding: Secondary | ICD-10-CM | POA: Diagnosis present

## 2013-02-27 DIAGNOSIS — E871 Hypo-osmolality and hyponatremia: Secondary | ICD-10-CM

## 2013-02-27 DIAGNOSIS — E876 Hypokalemia: Secondary | ICD-10-CM

## 2013-02-27 DIAGNOSIS — D696 Thrombocytopenia, unspecified: Secondary | ICD-10-CM | POA: Diagnosis present

## 2013-02-27 DIAGNOSIS — I4729 Other ventricular tachycardia: Secondary | ICD-10-CM

## 2013-02-27 DIAGNOSIS — E860 Dehydration: Secondary | ICD-10-CM

## 2013-02-27 DIAGNOSIS — F4310 Post-traumatic stress disorder, unspecified: Secondary | ICD-10-CM | POA: Diagnosis present

## 2013-02-27 DIAGNOSIS — G4700 Insomnia, unspecified: Secondary | ICD-10-CM | POA: Diagnosis present

## 2013-02-27 DIAGNOSIS — K922 Gastrointestinal hemorrhage, unspecified: Secondary | ICD-10-CM

## 2013-02-27 DIAGNOSIS — F909 Attention-deficit hyperactivity disorder, unspecified type: Secondary | ICD-10-CM | POA: Diagnosis present

## 2013-02-27 DIAGNOSIS — K2210 Ulcer of esophagus without bleeding: Secondary | ICD-10-CM

## 2013-02-27 DIAGNOSIS — R112 Nausea with vomiting, unspecified: Secondary | ICD-10-CM

## 2013-02-27 DIAGNOSIS — I5030 Unspecified diastolic (congestive) heart failure: Secondary | ICD-10-CM | POA: Diagnosis present

## 2013-02-27 DIAGNOSIS — D649 Anemia, unspecified: Secondary | ICD-10-CM | POA: Diagnosis present

## 2013-02-27 DIAGNOSIS — F1020 Alcohol dependence, uncomplicated: Secondary | ICD-10-CM | POA: Diagnosis present

## 2013-02-27 DIAGNOSIS — I509 Heart failure, unspecified: Secondary | ICD-10-CM | POA: Diagnosis present

## 2013-02-27 DIAGNOSIS — K859 Acute pancreatitis without necrosis or infection, unspecified: Principal | ICD-10-CM | POA: Diagnosis present

## 2013-02-27 DIAGNOSIS — K2211 Ulcer of esophagus with bleeding: Secondary | ICD-10-CM | POA: Diagnosis present

## 2013-02-27 DIAGNOSIS — R072 Precordial pain: Secondary | ICD-10-CM | POA: Diagnosis present

## 2013-02-27 DIAGNOSIS — I10 Essential (primary) hypertension: Secondary | ICD-10-CM | POA: Diagnosis present

## 2013-02-27 DIAGNOSIS — F3289 Other specified depressive episodes: Secondary | ICD-10-CM

## 2013-02-27 DIAGNOSIS — I472 Ventricular tachycardia, unspecified: Secondary | ICD-10-CM | POA: Diagnosis present

## 2013-02-27 DIAGNOSIS — IMO0002 Reserved for concepts with insufficient information to code with codable children: Secondary | ICD-10-CM

## 2013-02-27 DIAGNOSIS — F4321 Adjustment disorder with depressed mood: Secondary | ICD-10-CM

## 2013-02-27 DIAGNOSIS — F411 Generalized anxiety disorder: Secondary | ICD-10-CM | POA: Diagnosis present

## 2013-02-27 DIAGNOSIS — K920 Hematemesis: Secondary | ICD-10-CM

## 2013-02-27 DIAGNOSIS — F17200 Nicotine dependence, unspecified, uncomplicated: Secondary | ICD-10-CM | POA: Diagnosis present

## 2013-02-27 DIAGNOSIS — F10939 Alcohol use, unspecified with withdrawal, unspecified: Secondary | ICD-10-CM | POA: Diagnosis present

## 2013-02-27 DIAGNOSIS — F1010 Alcohol abuse, uncomplicated: Secondary | ICD-10-CM

## 2013-02-27 DIAGNOSIS — R9431 Abnormal electrocardiogram [ECG] [EKG]: Secondary | ICD-10-CM

## 2013-02-27 DIAGNOSIS — R42 Dizziness and giddiness: Secondary | ICD-10-CM | POA: Diagnosis present

## 2013-02-27 DIAGNOSIS — F10239 Alcohol dependence with withdrawal, unspecified: Secondary | ICD-10-CM | POA: Diagnosis present

## 2013-02-27 DIAGNOSIS — K219 Gastro-esophageal reflux disease without esophagitis: Secondary | ICD-10-CM | POA: Diagnosis present

## 2013-02-27 DIAGNOSIS — M25579 Pain in unspecified ankle and joints of unspecified foot: Secondary | ICD-10-CM | POA: Diagnosis present

## 2013-02-27 DIAGNOSIS — F329 Major depressive disorder, single episode, unspecified: Secondary | ICD-10-CM | POA: Diagnosis present

## 2013-02-27 HISTORY — PX: ESOPHAGOGASTRODUODENOSCOPY: SHX5428

## 2013-02-27 LAB — CBC WITH DIFFERENTIAL/PLATELET
Basophils Absolute: 0 10*3/uL (ref 0.0–0.1)
Basophils Relative: 0 % (ref 0–1)
Eosinophils Absolute: 0 10*3/uL (ref 0.0–0.7)
Eosinophils Relative: 0 % (ref 0–5)
HCT: 42.8 % (ref 36.0–46.0)
Hemoglobin: 15.4 g/dL — ABNORMAL HIGH (ref 12.0–15.0)
Lymphocytes Relative: 4 % — ABNORMAL LOW (ref 12–46)
Lymphs Abs: 0.7 10*3/uL (ref 0.7–4.0)
MCH: 33.1 pg (ref 26.0–34.0)
MCHC: 36 g/dL (ref 30.0–36.0)
MCV: 92 fL (ref 78.0–100.0)
Monocytes Absolute: 1.2 10*3/uL — ABNORMAL HIGH (ref 0.1–1.0)
Monocytes Relative: 7 % (ref 3–12)
Neutro Abs: 14.4 10*3/uL — ABNORMAL HIGH (ref 1.7–7.7)
Neutrophils Relative %: 88 % — ABNORMAL HIGH (ref 43–77)
Platelets: 225 10*3/uL (ref 150–400)
RBC: 4.65 MIL/uL (ref 3.87–5.11)
RDW: 12.5 % (ref 11.5–15.5)
WBC: 16.3 10*3/uL — ABNORMAL HIGH (ref 4.0–10.5)

## 2013-02-27 LAB — COMPREHENSIVE METABOLIC PANEL
ALT: 40 U/L — ABNORMAL HIGH (ref 0–35)
AST: 58 U/L — ABNORMAL HIGH (ref 0–37)
Albumin: 4.8 g/dL (ref 3.5–5.2)
Alkaline Phosphatase: 113 U/L (ref 39–117)
BUN: 33 mg/dL — ABNORMAL HIGH (ref 6–23)
CO2: 21 mEq/L (ref 19–32)
Calcium: 10.9 mg/dL — ABNORMAL HIGH (ref 8.4–10.5)
Chloride: 74 mEq/L — ABNORMAL LOW (ref 96–112)
Creatinine, Ser: 0.98 mg/dL (ref 0.50–1.10)
GFR calc Af Amer: 72 mL/min — ABNORMAL LOW (ref >90)
GFR calc non Af Amer: 62 mL/min — ABNORMAL LOW (ref >90)
Glucose, Bld: 218 mg/dL — ABNORMAL HIGH (ref 70–99)
Potassium: 2.7 mEq/L — CL (ref 3.7–5.3)
Sodium: 127 mEq/L — ABNORMAL LOW (ref 137–147)
Total Bilirubin: 2 mg/dL — ABNORMAL HIGH (ref 0.3–1.2)
Total Protein: 8.8 g/dL — ABNORMAL HIGH (ref 6.0–8.3)

## 2013-02-27 LAB — TYPE AND SCREEN
ABO/RH(D): A POS
Antibody Screen: NEGATIVE

## 2013-02-27 LAB — ABO/RH: ABO/RH(D): A POS

## 2013-02-27 LAB — MAGNESIUM: Magnesium: 2.1 mg/dL (ref 1.5–2.5)

## 2013-02-27 LAB — HEMOGLOBIN AND HEMATOCRIT, BLOOD
HEMATOCRIT: 32.5 % — AB (ref 36.0–46.0)
HEMOGLOBIN: 11.9 g/dL — AB (ref 12.0–15.0)

## 2013-02-27 LAB — TROPONIN I: Troponin I: <0.3 ng/mL (ref <0.30)

## 2013-02-27 LAB — LIPASE, BLOOD: Lipase: 204 U/L — ABNORMAL HIGH (ref 11–59)

## 2013-02-27 LAB — PROTIME-INR
INR: 0.97 (ref 0.00–1.49)
Prothrombin Time: 12.7 seconds (ref 11.6–15.2)

## 2013-02-27 SURGERY — EGD (ESOPHAGOGASTRODUODENOSCOPY)
Anesthesia: Moderate Sedation

## 2013-02-27 MED ORDER — ONDANSETRON HCL 4 MG/2ML IJ SOLN
4.0000 mg | Freq: Once | INTRAMUSCULAR | Status: AC
  Administered 2013-02-27: 4 mg via INTRAVENOUS
  Filled 2013-02-27: qty 2

## 2013-02-27 MED ORDER — SODIUM CHLORIDE 0.9 % IV BOLUS (SEPSIS)
1000.0000 mL | Freq: Once | INTRAVENOUS | Status: AC
  Administered 2013-02-27: 1000 mL via INTRAVENOUS

## 2013-02-27 MED ORDER — LORAZEPAM 2 MG/ML IJ SOLN
1.0000 mg | Freq: Four times a day (QID) | INTRAMUSCULAR | Status: AC | PRN
  Administered 2013-02-28 – 2013-03-01 (×2): 1 mg via INTRAVENOUS
  Filled 2013-02-27 (×2): qty 1

## 2013-02-27 MED ORDER — LORAZEPAM 2 MG/ML IJ SOLN
0.0000 mg | Freq: Four times a day (QID) | INTRAMUSCULAR | Status: AC
  Administered 2013-02-28: 1 mg via INTRAVENOUS
  Administered 2013-03-01 (×2): 2 mg via INTRAVENOUS
  Filled 2013-02-27 (×2): qty 1

## 2013-02-27 MED ORDER — POTASSIUM CHLORIDE 10 MEQ/100ML IV SOLN
10.0000 meq | Freq: Once | INTRAVENOUS | Status: AC
  Administered 2013-02-27: 10 meq via INTRAVENOUS
  Filled 2013-02-27: qty 100

## 2013-02-27 MED ORDER — BUTAMBEN-TETRACAINE-BENZOCAINE 2-2-14 % EX AERO
INHALATION_SPRAY | CUTANEOUS | Status: DC | PRN
  Administered 2013-02-27: 2 via TOPICAL

## 2013-02-27 MED ORDER — ADULT MULTIVITAMIN W/MINERALS CH
1.0000 | ORAL_TABLET | Freq: Every day | ORAL | Status: DC
  Administered 2013-02-27 – 2013-03-09 (×11): 1 via ORAL
  Filled 2013-02-27 (×11): qty 1

## 2013-02-27 MED ORDER — SODIUM CHLORIDE 0.9 % IV SOLN
INTRAVENOUS | Status: DC
  Administered 2013-02-27 – 2013-02-28 (×3): via INTRAVENOUS

## 2013-02-27 MED ORDER — FAMOTIDINE IN NACL 20-0.9 MG/50ML-% IV SOLN
20.0000 mg | Freq: Once | INTRAVENOUS | Status: AC
  Administered 2013-02-27: 20 mg via INTRAVENOUS
  Filled 2013-02-27: qty 50

## 2013-02-27 MED ORDER — ACETAMINOPHEN 325 MG PO TABS
650.0000 mg | ORAL_TABLET | Freq: Four times a day (QID) | ORAL | Status: DC | PRN
  Administered 2013-03-06: 650 mg via ORAL
  Filled 2013-02-27: qty 2

## 2013-02-27 MED ORDER — POTASSIUM CHLORIDE CRYS ER 20 MEQ PO TBCR
60.0000 meq | EXTENDED_RELEASE_TABLET | Freq: Once | ORAL | Status: AC
  Administered 2013-02-27: 60 meq via ORAL
  Filled 2013-02-27: qty 3

## 2013-02-27 MED ORDER — VITAMIN B-1 100 MG PO TABS
100.0000 mg | ORAL_TABLET | Freq: Every day | ORAL | Status: DC
  Administered 2013-02-27 – 2013-03-09 (×11): 100 mg via ORAL
  Filled 2013-02-27 (×11): qty 1

## 2013-02-27 MED ORDER — OXYCODONE HCL 5 MG PO TABS
5.0000 mg | ORAL_TABLET | ORAL | Status: DC | PRN
  Administered 2013-03-01 – 2013-03-09 (×13): 5 mg via ORAL
  Filled 2013-02-27 (×13): qty 1

## 2013-02-27 MED ORDER — FOLIC ACID 1 MG PO TABS
1.0000 mg | ORAL_TABLET | Freq: Every day | ORAL | Status: DC
  Administered 2013-02-27 – 2013-03-09 (×12): 1 mg via ORAL
  Filled 2013-02-27 (×11): qty 1

## 2013-02-27 MED ORDER — SODIUM CHLORIDE 0.9 % IV SOLN: INTRAVENOUS | Status: DC

## 2013-02-27 MED ORDER — FENTANYL CITRATE 0.05 MG/ML IJ SOLN
INTRAMUSCULAR | Status: DC | PRN
  Administered 2013-02-27 (×3): 25 ug via INTRAVENOUS

## 2013-02-27 MED ORDER — PANTOPRAZOLE SODIUM 40 MG IV SOLR: 40.0000 mg | Freq: Two times a day (BID) | INTRAVENOUS | Status: DC

## 2013-02-27 MED ORDER — LORAZEPAM 2 MG/ML IJ SOLN
0.0000 mg | Freq: Two times a day (BID) | INTRAMUSCULAR | Status: DC
  Administered 2013-03-02: 2 mg via INTRAVENOUS
  Filled 2013-02-27 (×2): qty 1

## 2013-02-27 MED ORDER — SODIUM CHLORIDE 0.9 % IV SOLN
INTRAVENOUS | Status: DC
  Administered 2013-02-27: 17:00:00 via INTRAVENOUS

## 2013-02-27 MED ORDER — SODIUM CHLORIDE 0.9 % IJ SOLN
3.0000 mL | Freq: Two times a day (BID) | INTRAMUSCULAR | Status: DC
  Administered 2013-02-28 – 2013-03-09 (×6): 3 mL via INTRAVENOUS

## 2013-02-27 MED ORDER — MIDAZOLAM HCL 10 MG/2ML IJ SOLN
INTRAMUSCULAR | Status: DC | PRN
  Administered 2013-02-27 (×2): 1 mg via INTRAVENOUS
  Administered 2013-02-27 (×3): 2 mg via INTRAVENOUS

## 2013-02-27 MED ORDER — POTASSIUM CHLORIDE CRYS ER 20 MEQ PO TBCR
40.0000 meq | EXTENDED_RELEASE_TABLET | Freq: Once | ORAL | Status: AC
  Administered 2013-02-27: 40 meq via ORAL
  Filled 2013-02-27: qty 2

## 2013-02-27 MED ORDER — DIPHENHYDRAMINE HCL 50 MG/ML IJ SOLN
INTRAMUSCULAR | Status: AC
  Filled 2013-02-27: qty 1

## 2013-02-27 MED ORDER — ACETAMINOPHEN 650 MG RE SUPP: 650.0000 mg | Freq: Four times a day (QID) | RECTAL | Status: DC | PRN

## 2013-02-27 MED ORDER — MIDAZOLAM HCL 10 MG/2ML IJ SOLN
INTRAMUSCULAR | Status: AC
  Filled 2013-02-27: qty 4

## 2013-02-27 MED ORDER — ONDANSETRON HCL 4 MG/2ML IJ SOLN
4.0000 mg | Freq: Four times a day (QID) | INTRAMUSCULAR | Status: DC | PRN
  Administered 2013-03-01 – 2013-03-05 (×2): 4 mg via INTRAVENOUS
  Filled 2013-02-27 (×2): qty 2

## 2013-02-27 MED ORDER — MORPHINE SULFATE 2 MG/ML IJ SOLN
2.0000 mg | INTRAMUSCULAR | Status: DC | PRN
  Administered 2013-02-27 – 2013-03-09 (×31): 2 mg via INTRAVENOUS
  Filled 2013-02-27 (×32): qty 1

## 2013-02-27 MED ORDER — LORAZEPAM 1 MG PO TABS
1.0000 mg | ORAL_TABLET | Freq: Four times a day (QID) | ORAL | Status: AC | PRN
Start: 2013-02-27 — End: 2013-03-02
  Administered 2013-02-28 – 2013-03-02 (×2): 1 mg via ORAL
  Filled 2013-02-27 (×3): qty 1

## 2013-02-27 MED ORDER — LORAZEPAM 2 MG/ML IJ SOLN
1.0000 mg | Freq: Once | INTRAMUSCULAR | Status: AC
  Administered 2013-02-27: 1 mg via INTRAVENOUS
  Filled 2013-02-27: qty 1

## 2013-02-27 MED ORDER — PANTOPRAZOLE SODIUM 40 MG IV SOLR
40.0000 mg | Freq: Two times a day (BID) | INTRAVENOUS | Status: DC
  Administered 2013-02-27 – 2013-03-08 (×18): 40 mg via INTRAVENOUS
  Filled 2013-02-27 (×21): qty 40

## 2013-02-27 MED ORDER — SUCRALFATE 1 GM/10ML PO SUSP
1.0000 g | Freq: Three times a day (TID) | ORAL | Status: DC
  Administered 2013-02-27 – 2013-03-09 (×38): 1 g via ORAL
  Filled 2013-02-27 (×47): qty 10

## 2013-02-27 MED ORDER — ONDANSETRON HCL 4 MG PO TABS: 4.0000 mg | ORAL_TABLET | Freq: Four times a day (QID) | ORAL | Status: DC | PRN

## 2013-02-27 MED ORDER — MORPHINE SULFATE 4 MG/ML IJ SOLN
4.0000 mg | Freq: Once | INTRAMUSCULAR | Status: AC
  Administered 2013-02-27: 4 mg via INTRAVENOUS
  Filled 2013-02-27: qty 1

## 2013-02-27 MED ORDER — FENTANYL CITRATE 0.05 MG/ML IJ SOLN
INTRAMUSCULAR | Status: AC
  Filled 2013-02-27: qty 4

## 2013-02-27 MED ORDER — THIAMINE HCL 100 MG/ML IJ SOLN
100.0000 mg | Freq: Every day | INTRAMUSCULAR | Status: DC
  Filled 2013-02-27 (×11): qty 1

## 2013-02-27 NOTE — ED Notes (Signed)
MD at bedside. 

## 2013-02-27 NOTE — Interval H&P Note (Signed)
History and Physical Interval Note:  02/27/2013 2:05 PM  Julie Carson  has presented today for surgery, with the diagnosis of UGI bleed  The various methods of treatment have been discussed with the patient and family. After consideration of risks, benefits and other options for treatment, the patient has consented to  Procedure(s): ESOPHAGOGASTRODUODENOSCOPY (EGD) (N/A) as a surgical intervention .  The patient's history has been reviewed, patient examined, no change in status, stable for surgery.  I have reviewed the patient's chart and labs.  Questions were answered to the patient's satisfaction.     Avacyn Kloosterman JR,Eliam Snapp L

## 2013-02-27 NOTE — Progress Notes (Signed)
[facility] provided pt with a list of self-pay pcp, [facility] [employer], and [facility] information.

## 2013-02-27 NOTE — ED Notes (Signed)
Wilson Singer, MD, made aware of K+ level.

## 2013-02-27 NOTE — Progress Notes (Signed)
Julie Carson [id number] Code Status: Full   Admission Data: 02/27/2013 5:55 PM YQM:VHQI, CAMMIE, MD Consults/ Treatment Team: Treatment Team:  Winfield Cunas., MD  Julie Carson is a 58 y.o. female patient admitted from ED awake, alert - oriented  X 3 - no acute distress noted.  VSS - Blood pressure 135/76, pulse 88, temperature 97.7 F (36.5 C), temperature source Oral, resp. rate 18, weight 59.3 kg (130 lb 11.7 oz), SpO2 99.00%.  no c/o shortness of breath, no c/o chest pain. Cardiac tele # 63 in place, cardiac monitor yields:normal sinus rhythm. IV Fluids:  IV in place, occlusive dsg intact without redness, IV cath antecubital left, condition: patent and no redness normal saline.  Allergies:  No Known Allergies   Past Medical History  Diagnosis Date  . Anxiety   . GERD (gastroesophageal reflux disease)   . PTSD (post-traumatic stress disorder)   . ADHD (attention deficit hyperactivity disorder)   . Migraine   . UTI (urinary tract infection)   . Hypertension   . Allergy   . Lesion of subcutaneous tissue 2013    Deltoid  . Pneumonia   . Animal dander allergy   . Arthritis   . Osteopenia    No prescriptions prior to admission   History:  obtained from chart review. Tobacco/alcohol: denied/ history of alcohol abuse stated  Orientation to room, and floor completed with information packet given to patient/family.  Admission INP armband ID verified with patient/family, and in place.   SR up x 2, fall assessment complete, with patient and family able to verbalize understanding of risk associated with falls, and verbalized understanding to call nsg before up out of bed.  Call light within reach, patient able to voice, and demonstrate understanding. Skin, clean-dry- intact without evidence of bruising, or skin tears.   No evidence of skin break down noted on exam.     Will continue to evaluate and treat per MD orders.  Julie Kotyk, RN 02/27/2013 5:55 PM

## 2013-02-27 NOTE — H&P (Signed)
[facility] History and Physical  Julie Carson QQV:[id number] DOB: Jun 08, 1955 DOA: 02/27/2013  Referring physician:  PCP: Antony Blackbird, MD   Chief Complaint: Nausea vomiting/abdominal pain  HPI: Julie Carson is a 58 y.o. female with a past medical history of alcohol abuse with history of alcohol withdrawal, major depression  Who presents to the emergency department with complaints of severe nausea/vomiting with associated abdominal pain. She states feeling depressed over the holiday season, which has led to drinking more. She states developing abdominal pain 2-3 weeks ago, associated with nausea vomiting, which has progressively worsened over the past week. She has been unable to tolerate by mouth due to multiple episodes of nausea and vomiting. Her abdominal pain is located in the epigastric region characterized as sharp, stabbing and nonradiating. She also reports having several episodes of hematemesis overnight, however denies bright red blood per rectum or melena. She denies having a history of hematemesis nor has she undergone EGD/colonoscopy in the past. Lab work in the emergency department showed a hemoglobin of 15.4 with hematocrit of 42.8. Patient also complains of generalized weakness, poor tolerance to physical exertion, dizziness, lightheadedness. she was found to have multiple electrolyte abnormalities in the emergency department, was started on IV fluid resuscitation and administered potassium replacement therapy. I spoke to Dr. Oletta Lamas of gastroenterology for a consultation regarding her history of hematemesis.                                                                                                                                                                                               Review of Systems:  Constitutional:  No weight loss, night sweats, Fevers, chills, fatigue.  HEENT:  No headaches, Difficulty swallowing,Tooth/dental problems,Sore throat,  No sneezing,  itching, ear ache, nasal congestion, post nasal drip,  Cardio-vascular:  No chest pain, Orthopnea, PND, swelling in lower extremities, anasarca, dizziness, palpitations  GI:  Positive for abdominal pain, nausea, vomiting, diarrhea, change in bowel habits, and loss of appetite  Resp:  No shortness of breath with exertion or at rest. No excess mucus, no productive cough, No non-productive cough, No coughing up of blood.No change in color of mucus.No wheezing.No chest wall deformity  Skin:  no rash or lesions.  GU:  no dysuria, change in color of urine, no urgency or frequency. No flank pain.  Musculoskeletal:  No joint pain or swelling. No decreased range of motion. No back pain.  Psych:  Positive for depression or anxiety. No memory loss.   Past Medical History  Diagnosis Date  . Anxiety   . GERD (gastroesophageal reflux disease)   . PTSD (post-traumatic stress  disorder)   . ADHD (attention deficit hyperactivity disorder)   . Migraine   . UTI (urinary tract infection)   . Hypertension   . Allergy   . Lesion of subcutaneous tissue 2013    Deltoid  . Pneumonia   . Animal dander allergy   . Arthritis   . Osteopenia    Past Surgical History  Procedure Laterality Date  . Breast enhancement surgery    . Liposuction    . Basal cell carcinoma excision      L neck and shoulder  . Nose surgery    . Mass excision  10/24/2011    Procedure: EXCISION MASS;  Surgeon: Leighton Ruff, MD;  Location: [place];  Service: General;  Laterality: Left;   Social History:  reports that she has been smoking Cigarettes.  She has been smoking about 0.00 packs per day. She has never used smokeless tobacco. She reports that she drinks alcohol. She reports that she does not use illicit drugs.  No Known Allergies  No family history on file.   Prior to Admission medications   Not on File   Physical Exam: Filed Vitals:   02/27/13 0925  BP: 147/98  Pulse: 106  Temp: 98.4 F (36.9 C)  Resp: 16     BP 147/98  Pulse 106  Temp(Src) 98.4 F (36.9 C) (Oral)  Resp 16  SpO2 100%  General:  Appears calm and comfortable Eyes: PERRL, normal lids, irises & conjunctiva ENT: grossly normal hearing, lips & tongue Neck: no LAD, masses or thyromegaly Cardiovascular: RRR, no m/r/g. No LE edema. Telemetry: SR, no arrhythmias  Respiratory: CTA bilaterally, no w/r/r. Normal respiratory effort. Abdomen: Patient having generalized abdominal pain with palpation, especially over the epigastric region. There is no rebound tenderness or guarding positive bowel sounds are present in all 4 quadrants. Skin: no rash or induration seen on limited exam Musculoskeletal: grossly normal tone BUE/BLE Psychiatric: grossly normal mood and affect, speech fluent and appropriate Neurologic: grossly non-focal.          Labs on Admission:  Basic Metabolic Panel:  Recent Labs Lab 02/27/13 0958  NA 127*  K 2.7*  CL 74*  CO2 21  GLUCOSE 218*  BUN 33*  CREATININE 0.98  CALCIUM 10.9*  MG 2.1   Liver Function Tests:  Recent Labs Lab 02/27/13 0958  AST 58*  ALT 40*  ALKPHOS 113  BILITOT 2.0*  PROT 8.8*  ALBUMIN 4.8    Recent Labs Lab 02/27/13 0958  LIPASE 204*   No results found for this basename: AMMONIA,  in the last 168 hours CBC:  Recent Labs Lab 02/27/13 0958  WBC 16.3*  NEUTROABS 14.4*  HGB 15.4*  HCT 42.8  MCV 92.0  PLT 225   Cardiac Enzymes:  Recent Labs Lab 02/27/13 0958  TROPONINI <0.30    BNP (last 3 results) No results found for this basename: PROBNP,  in the last 8760 hours CBG: No results found for this basename: GLUCAP,  in the last 168 hours  Radiological Exams on Admission: Dg Chest 2 View  02/27/2013   CLINICAL DATA:  Chest pain, throwing up blood  EXAM: CHEST  2 VIEW  COMPARISON:  DG CHEST 2 VIEW dated 04/14/2012  FINDINGS: The heart size and mediastinal contours are within normal limits. Both lungs are clear. The visualized skeletal structures are  unremarkable.  IMPRESSION: No active cardiopulmonary disease.   Electronically Signed   By: Kathreen Devoid   On: 02/27/2013 10:20  EKG: Independently reviewed.   Assessment/Plan Active Problems:   Hypokalemia   Pancreatitis   Upper GI bleed   Nausea & vomiting   Alcohol abuse   Hyponatremia   1. Probable alcohol-induced pancreatitis. Patient presenting with abdominal pain located in the epigastric region. This is associate with multiple episodes of nausea and vomiting. Initial lab work showing a lipase level of 204. She has a history of alcohol abuse, drinking more over the holidays. Will admit patient to the inpatient service, make her n.p.o., start aggressive IV fluid resuscitation, correct electrolytes, as needed narcotic analgesia.  2. Abdominal pain. I suspect secondary to alcohol-induced pancreatitis however other possibilities could include peptic ulcer disease/gastritis given her history of hematemesis. Provide supportive care, IV fluids, n.p.o., GI consultation placed. 3. Hematemesis. Patient reporting having multiple episodes of hematemesis overnight. Initial lab work showing hemoglobin of 15.4 with hematocrit of 42.8. She has not had witnessed episodes of hematemesis in the emergency department. Will start Protonix 40 mg IV twice a day. I spoke to Dr. Oletta Lamas of gastroenterology for consultation. She has been typed and screened.  4. Hyponatremia. Lab work showing a sodium of 127, likely secondary to hypotonic hypovolemic hyponatremia. Provide IV fluid resuscitation with normal saline. 5. Hypokalemia. Patient presenting with a potassium of 2.7. She was given IV and oral potassium replacement in the emergency department, followup on a.m. BMP. Hypokalemia likely resulted from GI loss in setting of multiple episodes of nausea and vomiting. 6. Alcohol abuse. Patient does not show signs or symptoms of alcohol withdrawal however will place her on the CIWA protocol given her history of alcohol  withdrawal. 7. Leukocytosis. Patient on white count of 16,300, could be secondary to acute pancreatitis. 8. Nutrition. N.p.o. 9. DVT prophylaxis. SCDs    Code Status: Full Code Disposition Plan: I anticipate she will require greater than 2 nights hospitalization  Time spent: 70 minutes  Kelvin Cellar [facility] Pager [phone]

## 2013-02-27 NOTE — ED Notes (Signed)
Patient transported to Endoscopy

## 2013-02-27 NOTE — ED Notes (Signed)
Per endoscopy, results showed no bleeding, no varices, distal esophagitis. Per endoscopy, endo administered 75 mcg Fentanyl, 8 mg Versed, 25 mg Benadryl. Most recent vitals were 144/97, 98, 100 % RA, 20, pain 0/10, A & O x 4.

## 2013-02-27 NOTE — Progress Notes (Signed)
UR completed 

## 2013-02-27 NOTE — ED Provider Notes (Signed)
CSN: [id number]     Arrival date & time 02/27/13  7341 History   First MD Initiated Contact with Patient 02/27/13 [phone]     Chief Complaint  Patient presents with  . Hematemesis  . Chest Pain  . Dizziness  . Shortness of Breath   (Consider location/radiation/quality/duration/timing/severity/associated sxs/prior Treatment) HPI  57yF with multiple complaints. Pt tends to be vague in her answers. Essentially has been feeling poorly over the past few weeks. Generalized weakness. Nausea and vomiting. More concerned because began having hematemesis last night. Describes vomitus as mostly, if not all, blood. Feels dizzy/lightheaded as if she may pass out. Worsened with standing and exertion. Sob with exertion as well. No BRBPR or melena. No diarrhea. No blood thinners. Alcoholic. No hx of varices that she is aware of. Doesn't think she's ever had an EGD. Hx of GERD. Current symptoms feel different. Pressure and burning in upper abdomen and chest. Constant for over a week and worsening. No urinary complaints. No sick contacts. Lives alone.  Here with friend.     Past Medical History  Diagnosis Date  . Anxiety   . GERD (gastroesophageal reflux disease)   . PTSD (post-traumatic stress disorder)   . ADHD (attention deficit hyperactivity disorder)   . Migraine   . UTI (urinary tract infection)   . Hypertension   . Allergy   . Lesion of subcutaneous tissue 2013    Deltoid  . Pneumonia   . Animal dander allergy   . Arthritis   . Osteopenia    Past Surgical History  Procedure Laterality Date  . Breast enhancement surgery    . Liposuction    . Basal cell carcinoma excision      L neck and shoulder  . Nose surgery    . Mass excision  10/24/2011    Procedure: EXCISION MASS;  Surgeon: Leighton Ruff, MD;  Location: [facility];  Service: General;  Laterality: Left;   No family history on file. History  Substance Use Topics  . Smoking status: Current Every Day Smoker    Types: Cigarettes  .  Smokeless tobacco: Never Used  . Alcohol Use: Yes     Comment: daily alcohol intake   OB History   Grav Para Term Preterm Abortions TAB SAB Ect Mult Living                 Review of Systems  All systems reviewed and negative, other than as noted in HPI.   Allergies  Review of patient's allergies indicates no known allergies.  Home Medications   Current Outpatient Rx  Name  Route  Sig  Dispense  Refill  . aspirin EC 81 MG tablet   Oral   Take 81 mg by mouth daily.         . busPIRone (BUSPAR) 10 MG tablet   Oral   Take 1 tablet (10 mg total) by mouth 3 (three) times daily.   90 tablet   0   . cetirizine (ZYRTEC) 10 MG tablet   Oral   Take 10 mg by mouth daily.         . chlordiazePOXIDE (LIBRIUM) 5 MG capsule      Please dispense 18 pills - Take 1 pill three times a day for 3 days, then Take 1 pill two times a day for 3 days, then Take 1 pill once a day for 3 days and stop.   18 capsule   0   . folic acid (FOLVITE) 1 MG  tablet   Oral   Take 1 tablet (1 mg total) by mouth daily.   30 tablet   0   . lisdexamfetamine (VYVANSE) 40 MG capsule   Oral   Take 1 capsule (40 mg total) by mouth daily.   30 capsule   0   . Multiple Vitamin (MULTIVITAMIN WITH MINERALS) TABS   Oral   Take 1 tablet by mouth daily.   30 tablet   0   . ranitidine (ZANTAC) 150 MG tablet   Oral   Take 150 mg by mouth 2 (two) times daily as needed for heartburn.          . thiamine 100 MG tablet   Oral   Take 1 tablet (100 mg total) by mouth daily.   30 tablet   0    BP 147/98  Pulse 106  Temp(Src) 98.4 F (36.9 C) (Oral)  Resp 16  SpO2 100% Physical Exam  Nursing note and vitals reviewed. Constitutional:  Frail appearing  HENT:  Head: Normocephalic and atraumatic.  No blood or dark material noted on lips or oropharynx  Eyes: Conjunctivae are normal. Right eye exhibits no discharge. Left eye exhibits no discharge.  Neck: Neck supple.  Cardiovascular: Regular  rhythm and normal heart sounds.  Exam reveals no gallop and no friction rub.   No murmur heard. Tachycardic with regular rhythm  Pulmonary/Chest: Effort normal and breath sounds normal. No respiratory distress.  Abdominal: Soft. She exhibits no distension. There is tenderness. There is no rebound.  eipgastric tenderness without rebound or guarding.  Musculoskeletal: She exhibits no edema and no tenderness.  Neurological: She is alert.  Skin: Skin is warm and dry.  Psychiatric: Her behavior is normal. Thought content normal.  anxious    ED Course  Procedures (including critical care time) Labs Review Labs Reviewed  CBC WITH DIFFERENTIAL - Abnormal; Notable for the following:    WBC 16.3 (*)    Hemoglobin 15.4 (*)    Neutrophils Relative % 88 (*)    Neutro Abs 14.4 (*)    Lymphocytes Relative 4 (*)    Monocytes Absolute 1.2 (*)    All other components within normal limits  COMPREHENSIVE METABOLIC PANEL - Abnormal; Notable for the following:    Sodium 127 (*)    Potassium 2.7 (*)    Chloride 74 (*)    Glucose, Bld 218 (*)    BUN 33 (*)    Calcium 10.9 (*)    Total Protein 8.8 (*)    AST 58 (*)    ALT 40 (*)    Total Bilirubin 2.0 (*)    GFR calc non Af Amer 62 (*)    GFR calc Af Amer 72 (*)    All other components within normal limits  LIPASE, BLOOD - Abnormal; Notable for the following:    Lipase 204 (*)    All other components within normal limits  TROPONIN I  MAGNESIUM  PROTIME-INR  TYPE AND SCREEN  ABO/RH   Imaging Review Dg Chest 2 View  02/27/2013   CLINICAL DATA:  Chest pain, throwing up blood  EXAM: CHEST  2 VIEW  COMPARISON:  DG CHEST 2 VIEW dated 04/14/2012  FINDINGS: The heart size and mediastinal contours are within normal limits. Both lungs are clear. The visualized skeletal structures are unremarkable.  IMPRESSION: No active cardiopulmonary disease.   Electronically Signed   By: Kathreen Devoid   On: 02/27/2013 10:20    EKG Interpretation  Date/Time:  Friday February 27 2013 09:51:19 EST Ventricular Rate:  98 PR Interval:  133 QRS Duration: 94 QT Interval:  403 QTC Calculation: 515 R Axis:   -92 Text Interpretation:  Sinus rhythm left atrial enlargement Prolonged QT interval Non-specific ST-t changes No significant change since last tracing Confirmed by Antario Yasuda  MD, Nicolet Griffy ([id number]) on 02/27/2013 10:11:20 AM            MDM   1. Pancreatitis   2. Dehydration   3. Hypokalemia   4. Hyponatremia   5. Hematemesis   6. Alcohol abuse   7. Prolonged Q-T interval on ECG     58 year old female with multiple complaints, but primarily abdominal pain and hematemesis. She's not had any hematemesis here in the emergency room as of now but she describes a large amount at home. My concern with her history of alcohol abuse is that she may potentially have variceal bleeding. She's not on any blood thinners, but consider endogenous coagulopathy secondary to her alcohol abuse. Previous platelet counts have been normal though. Will additionally check an INR. She is tachycardic but her blood pressure is fine. IV access established. IV fluids. H2 blocker, pain medicine and antiemetics. Dose of ativan as she seems quite anxious. No increased WOB or hypoxemia. Basic labs, LFTs and lipase for possible pancreatitis. Type and screen in case she is significantly anemic. CP is atypical for ACS and more consistent with a GI process. EKG w/o overt ischemic changes. QTc prolonged, but pt denies syncope. Will check troponin. Anticipate admission.   11:09 AM Elevation in lipase. Likely 2/2 ETOH abuse and would explain n/v and abdominal pain. Consider gallstone, but I feel less likely with her history. She does have minimal elevation in ALT/AST and t bili elevated at 2.0 though. Cr normal but increased from baseline and decreased GFR. Elevated BUN. May reflect volume loss with vomiting. Hemoconcentration on CBC. Also consider UGI bleed. H/H reassuring. Still no  hematemesis in ED. Normal platelets and INR. Hypokalemic. Supplementation started. Magnesium normal. Hyponatremia/hypochloremia. Dehydration vs [place] vs GI loss. IVF. Pt feeling better but still pretty symptomatic. Will discuss with medicine. PCP listed as Cammie Fulp. Pt reports she has no regular medical care though.     Virgel Manifold, MD 02/27/13 1128

## 2013-02-27 NOTE — ED Notes (Signed)
Bed: WA17 Expected date: 02/27/13 Expected time:  Means of arrival:  Comments: Julie Carson

## 2013-02-27 NOTE — Op Note (Signed)
[facility] [place], [id number]   ENDOSCOPY PROCEDURE REPORT  PATIENT: Julie Carson, Julie Carson  MR#: [id number] BIRTHDATE: 25-Jan-1956 , 70  yrs. old GENDER: Female ENDOSCOPIST:Georgeanna Radziewicz Oletta Lamas, MD REFERRED BY:  ER. [facility]. PROCEDURE DATE:  02/27/2013 PROCEDURE:   EGD ASA CLASS:     class 2 INDICATIONS:  patient has been drinking fairly heavily the past several weeks and is felt sick with nausea and vomiting. She apparently has had hematemesis. No known history liver disease. MEDICATION:  fentanyl 75 mcg, versed 5 mg, Benadryl 25 mg IV TOPICAL ANESTHETIC:    cetacaine spray  DESCRIPTION OF PROCEDURE:   The Pentax adult endoscope was inserted blindly into the esophagus with swallow. The proximal softness was endoscopically normal but the bottom half of the esophagus was severely ulcerated and friable with coffee ground material in the esophagus. There was no active bleeding. His really confluent ulcers that extended from the GE junction up to approximately 1/2 the way out the esophagus. There were no obvious varices. The stomach was entered in the pylorus  was identified and passed. The 2nd portion of the duodenum and the duodenal bulb were normal with no ulceration or inflammation. The scope is withdrawn back into the stomach and the stomach was examined in the forward and retroflex view was no significant findings. There was no sign of gastric varices. The scope is withdrawn the patient tolerated procedure well.     COMPLICATIONS: None  ENDOSCOPIC IMPRESSION: 1. Severe Ulcerative Esophagitis. This involves the bottom half of the esophagus and is probably due to vomiting and reflux. No varices were seen.  RECOMMENDATIONS: 1. would continue to treat her pancreatitis was supportive care. 2. We'll place her on PPI therapy IV BID. She will likely need to be discharged on oral PPI therapy for reflux.    _______________________________ Rogue Bussing, MD 02/27/2013 2:51 PM

## 2013-02-27 NOTE — H&P (View-Only) (Signed)
[facility] Reason for consult: Hematemesis Referring Physician: [facility]. Primary G.I.: none patient unassigned. PCP: none  Julie Carson is an 57 y.o. female.  HPI: she has a history of alcohol abuse and depression. Her father died: in nursing home about 14 months ago. Since then she's had to put her mother in nursing home. This is her 1st Christmas without her father. She tends to drink to live protonix today it has for some time but during the weeks leading up to Christmas drink much more heavily to difficulty sleeping and depression about being alone the Christmas without her parents. About 2 to 3 weeks ago she began to have epigastric pain. She has taken Nexium and Prilosec in the past but is not been taking many recently. She's not been able to eat anything due to her pain and nausea is not have bowel movements. She states that she's been having nausea and vomiting for the past several days and has felt that she was dehydrated. Yesterday she had several episodes of hematemesis described as dark tarry material. She has had no further vomiting since coming into the emergency room early this morning. She denies any melena and does not remember the last time that she had a bowel movement. In the emergency room she was found to have lipase 200, minimal elevation of liver tests with total bilirubin 2.0 and hemoglobin 15.4. Platelet count and PT were normal. Patient has never had EGD never had a history of previous ulcers.  Past Medical History  Diagnosis Date  . Anxiety   . GERD (gastroesophageal reflux disease)   . PTSD (post-traumatic stress disorder)   . ADHD (attention deficit hyperactivity disorder)   . Migraine   . UTI (urinary tract infection)   . Hypertension   . Allergy   . Lesion of subcutaneous tissue 2013    Deltoid  . Pneumonia   . Animal dander allergy   . Arthritis   . Osteopenia     Past Surgical History  Procedure Laterality Date  . Breast  enhancement surgery    . Liposuction    . Basal cell carcinoma excision      L neck and shoulder  . Nose surgery    . Mass excision  10/24/2011    Procedure: EXCISION MASS;  Surgeon: Alicia Thomas, MD;  Location: [facility];  Service: General;  Laterality: Left;    No family history on file.  Social History:  reports that she has been smoking Cigarettes.  She has been smoking about 0.00 packs per day. She has never used smokeless tobacco. She reports that she drinks alcohol. She reports that she does not use illicit drugs.  Allergies: No Known Allergies  Medications; Prior to Admission medications   Not on File    PRN Meds    Results for orders placed during the hospital encounter of 02/27/13 (from the past 48 hour(s))  CBC WITH DIFFERENTIAL     Status: Abnormal   Collection Time    02/27/13  9:58 AM      Result Value Range   WBC 16.3 (*) 4.0 - 10.5 K/uL   RBC 4.65  3.87 - 5.11 MIL/uL   Hemoglobin 15.4 (*) 12.0 - 15.0 g/dL   HCT 42.8  36.0 - 46.0 %   MCV 92.0  78.0 - 100.0 fL   MCH 33.1  26.0 - 34.0 pg   MCHC 36.0  30.0 - 36.0 g/dL   RDW 12.5  11.5 - 15.5 %     Platelets 225  150 - 400 K/uL   Neutrophils Relative % 88 (*) 43 - 77 %   Neutro Abs 14.4 (*) 1.7 - 7.7 K/uL   Lymphocytes Relative 4 (*) 12 - 46 %   Lymphs Abs 0.7  0.7 - 4.0 K/uL   Monocytes Relative 7  3 - 12 %   Monocytes Absolute 1.2 (*) 0.1 - 1.0 K/uL   Eosinophils Relative 0  0 - 5 %   Eosinophils Absolute 0.0  0.0 - 0.7 K/uL   Basophils Relative 0  0 - 1 %   Basophils Absolute 0.0  0.0 - 0.1 K/uL  TYPE AND SCREEN     Status: None   Collection Time    02/27/13  9:58 AM      Result Value Range   ABO/RH(D) A POS     Antibody Screen NEG     Sample Expiration 03/02/2013    COMPREHENSIVE METABOLIC PANEL     Status: Abnormal   Collection Time    02/27/13  9:58 AM      Result Value Range   Sodium 127 (*) 137 - 147 mEq/L   Comment: REPEATED TO VERIFY   Potassium 2.7 (*) 3.7 - 5.3 mEq/L   Comment: REPEATED TO  VERIFY     CRITICAL RESULT CALLED TO, READ BACK BY AND VERIFIED WITH:     DAVIS,N RN AT 1100 01.09.2015 BY LINEBERRY,R   Chloride 74 (*) 96 - 112 mEq/L   Comment: REPEATED TO VERIFY   CO2 21  19 - 32 mEq/L   Comment: REPEATED TO VERIFY   Glucose, Bld 218 (*) 70 - 99 mg/dL   BUN 33 (*) 6 - 23 mg/dL   Creatinine, Ser 0.98  0.50 - 1.10 mg/dL   Calcium 10.9 (*) 8.4 - 10.5 mg/dL   Total Protein 8.8 (*) 6.0 - 8.3 g/dL   Albumin 4.8  3.5 - 5.2 g/dL   AST 58 (*) 0 - 37 U/L   ALT 40 (*) 0 - 35 U/L   Alkaline Phosphatase 113  39 - 117 U/L   Total Bilirubin 2.0 (*) 0.3 - 1.2 mg/dL   GFR calc non Af Amer 62 (*) >90 mL/min   GFR calc Af Amer 72 (*) >90 mL/min   Comment: (NOTE)     The eGFR has been calculated using the CKD EPI equation.     This calculation has not been validated in all clinical situations.     eGFR's persistently <90 mL/min signify possible Chronic Kidney     Disease.  LIPASE, BLOOD     Status: Abnormal   Collection Time    02/27/13  9:58 AM      Result Value Range   Lipase 204 (*) 11 - 59 U/L  TROPONIN I     Status: None   Collection Time    02/27/13  9:58 AM      Result Value Range   Troponin I <0.30  <0.30 ng/mL   Comment:            Due to the release kinetics of cTnI,     a negative result within the first hours     of the onset of symptoms does not rule out     myocardial infarction with certainty.     If myocardial infarction is still suspected,     repeat the test at appropriate intervals.  MAGNESIUM     Status: None   Collection Time    02/27/13    9:58 AM      Result Value Range   Magnesium 2.1  1.5 - 2.5 mg/dL  PROTIME-INR     Status: None   Collection Time    02/27/13  9:58 AM      Result Value Range   Prothrombin Time 12.7  11.6 - 15.2 seconds   INR 0.97  0.00 - 1.49  ABO/RH     Status: None   Collection Time    02/27/13  9:58 AM      Result Value Range   ABO/RH(D) A POS      Dg Chest 2 View  02/27/2013   CLINICAL DATA:  Chest pain, throwing  up blood  EXAM: CHEST  2 VIEW  COMPARISON:  DG CHEST 2 VIEW dated 04/14/2012  FINDINGS: The heart size and mediastinal contours are within normal limits. Both lungs are clear. The visualized skeletal structures are unremarkable.  IMPRESSION: No active cardiopulmonary disease.   Electronically Signed   By: Kathreen Devoid   On: 02/27/2013 10:20               Blood pressure 147/98, pulse 106, temperature 98.4 F (36.9 C), temperature source Oral, resp. rate 16, SpO2 100.00%.  Physical exam:   General-- pleasant white female. Alert and oriented she is nonicteric. No stigmata of liver disease. Heart-- regular rate and rhythm without murmurs are gallops Lungs--clear Abdomen-- soft and honest tended to diminish bowel sounds. Tender in the epigastrium.   Assessment: 1. Hematemesis. The patient does not really have a strong suggestion of alcoholic liver disease but this is possible. She has not really taken any NSAIDs. More than likely this is gastritis, ulcer, Mallery Weiss tear etc. 2. Pancreatitis. Probably due to her recent history of heavy alcohol ingestion 3. History of alcohol abuse with increased and alcohol intake due to depression during the holidays.  Plan: 1. Continues supportive therapy for pancreatitis. 2. Will plan EGD this afternoon to evaluate for cause of her hematemesis. We have discussed banding the varices, control of bleeding etc. She is agreeable to going ahead.   Sheniya Garciaperez JR,Careen Mauch L 02/27/2013, 1:47 PM

## 2013-02-27 NOTE — Consult Note (Signed)
[facility] CONSULT Reason for consult: Hematemesis Referring Physician: [facility]. Primary G.I.: none patient unassigned. PCP: none  Julie Carson is an 58 y.o. female.  HPI: she has a history of alcohol abuse and depression. Her father died: in nursing home about 14 months ago. Since then she's had to put her mother in nursing home. This is her 1st Christmas without her father. She tends to drink to live protonix today it has for some time but during the weeks leading up to Christmas drink much more heavily to difficulty sleeping and depression about being alone the Christmas without her parents. About 2 to 3 weeks ago she began to have epigastric pain. She has taken Nexium and Prilosec in the past but is not been taking many recently. She's not been able to eat anything due to her pain and nausea is not have bowel movements. She states that she's been having nausea and vomiting for the past several days and has felt that she was dehydrated. Yesterday she had several episodes of hematemesis described as dark tarry material. She has had no further vomiting since coming into the emergency room early this morning. She denies any melena and does not remember the last time that she had a bowel movement. In the emergency room she was found to have lipase 200, minimal elevation of liver tests with total bilirubin 2.0 and hemoglobin 15.4. Platelet count and PT were normal. Patient has never had EGD never had a history of previous ulcers.  Past Medical History  Diagnosis Date  . Anxiety   . GERD (gastroesophageal reflux disease)   . PTSD (post-traumatic stress disorder)   . ADHD (attention deficit hyperactivity disorder)   . Migraine   . UTI (urinary tract infection)   . Hypertension   . Allergy   . Lesion of subcutaneous tissue 2013    Deltoid  . Pneumonia   . Animal dander allergy   . Arthritis   . Osteopenia     Past Surgical History  Procedure Laterality Date  . Breast  enhancement surgery    . Liposuction    . Basal cell carcinoma excision      L neck and shoulder  . Nose surgery    . Mass excision  10/24/2011    Procedure: EXCISION MASS;  Surgeon: Leighton Ruff, MD;  Location: [facility];  Service: General;  Laterality: Left;    No family history on file.  Social History:  reports that she has been smoking Cigarettes.  She has been smoking about 0.00 packs per day. She has never used smokeless tobacco. She reports that she drinks alcohol. She reports that she does not use illicit drugs.  Allergies: No Known Allergies  Medications; Prior to Admission medications   Not on File    PRN Meds    Results for orders placed during the hospital encounter of 02/27/13 (from the past 48 hour(s))  CBC WITH DIFFERENTIAL     Status: Abnormal   Collection Time    02/27/13  9:58 AM      Result Value Range   WBC 16.3 (*) 4.0 - 10.5 K/uL   RBC 4.65  3.87 - 5.11 MIL/uL   Hemoglobin 15.4 (*) 12.0 - 15.0 g/dL   HCT 42.8  36.0 - 46.0 %   MCV 92.0  78.0 - 100.0 fL   MCH 33.1  26.0 - 34.0 pg   MCHC 36.0  30.0 - 36.0 g/dL   RDW 12.5  11.5 - 15.5 %  Platelets 225  150 - 400 K/uL   Neutrophils Relative % 88 (*) 43 - 77 %   Neutro Abs 14.4 (*) 1.7 - 7.7 K/uL   Lymphocytes Relative 4 (*) 12 - 46 %   Lymphs Abs 0.7  0.7 - 4.0 K/uL   Monocytes Relative 7  3 - 12 %   Monocytes Absolute 1.2 (*) 0.1 - 1.0 K/uL   Eosinophils Relative 0  0 - 5 %   Eosinophils Absolute 0.0  0.0 - 0.7 K/uL   Basophils Relative 0  0 - 1 %   Basophils Absolute 0.0  0.0 - 0.1 K/uL  TYPE AND SCREEN     Status: None   Collection Time    02/27/13  9:58 AM      Result Value Range   ABO/RH(D) A POS     Antibody Screen NEG     Sample Expiration 03/02/2013    COMPREHENSIVE METABOLIC PANEL     Status: Abnormal   Collection Time    02/27/13  9:58 AM      Result Value Range   Sodium 127 (*) 137 - 147 mEq/L   Comment: REPEATED TO VERIFY   Potassium 2.7 (*) 3.7 - 5.3 mEq/L   Comment: REPEATED TO  VERIFY     CRITICAL RESULT CALLED TO, READ BACK BY AND VERIFIED WITH:     DAVIS,N RN AT 1100 01.09.2015 BY LINEBERRY,R   Chloride 74 (*) 96 - 112 mEq/L   Comment: REPEATED TO VERIFY   CO2 21  19 - 32 mEq/L   Comment: REPEATED TO VERIFY   Glucose, Bld 218 (*) 70 - 99 mg/dL   BUN 33 (*) 6 - 23 mg/dL   Creatinine, Ser 0.98  0.50 - 1.10 mg/dL   Calcium 10.9 (*) 8.4 - 10.5 mg/dL   Total Protein 8.8 (*) 6.0 - 8.3 g/dL   Albumin 4.8  3.5 - 5.2 g/dL   AST 58 (*) 0 - 37 U/L   ALT 40 (*) 0 - 35 U/L   Alkaline Phosphatase 113  39 - 117 U/L   Total Bilirubin 2.0 (*) 0.3 - 1.2 mg/dL   GFR calc non Af Amer 62 (*) >90 mL/min   GFR calc Af Amer 72 (*) >90 mL/min   Comment: (NOTE)     The eGFR has been calculated using the CKD EPI equation.     This calculation has not been validated in all clinical situations.     eGFR's persistently <90 mL/min signify possible Chronic Kidney     Disease.  LIPASE, BLOOD     Status: Abnormal   Collection Time    02/27/13  9:58 AM      Result Value Range   Lipase 204 (*) 11 - 59 U/L  TROPONIN I     Status: None   Collection Time    02/27/13  9:58 AM      Result Value Range   Troponin I <0.30  <0.30 ng/mL   Comment:            Due to the release kinetics of cTnI,     a negative result within the first hours     of the onset of symptoms does not rule out     myocardial infarction with certainty.     If myocardial infarction is still suspected,     repeat the test at appropriate intervals.  MAGNESIUM     Status: None   Collection Time    02/27/13  9:58 AM      Result Value Range   Magnesium 2.1  1.5 - 2.5 mg/dL  PROTIME-INR     Status: None   Collection Time    02/27/13  9:58 AM      Result Value Range   Prothrombin Time 12.7  11.6 - 15.2 seconds   INR 0.97  0.00 - 1.49  ABO/RH     Status: None   Collection Time    02/27/13  9:58 AM      Result Value Range   ABO/RH(D) A POS      Dg Chest 2 View  02/27/2013   CLINICAL DATA:  Chest pain, throwing  up blood  EXAM: CHEST  2 VIEW  COMPARISON:  DG CHEST 2 VIEW dated 04/14/2012  FINDINGS: The heart size and mediastinal contours are within normal limits. Both lungs are clear. The visualized skeletal structures are unremarkable.  IMPRESSION: No active cardiopulmonary disease.   Electronically Signed   By: Kathreen Devoid   On: 02/27/2013 10:20               Blood pressure 147/98, pulse 106, temperature 98.4 F (36.9 C), temperature source Oral, resp. rate 16, SpO2 100.00%.  Physical exam:   General-- pleasant white female. Alert and oriented she is nonicteric. No stigmata of liver disease. Heart-- regular rate and rhythm without murmurs are gallops Lungs--clear Abdomen-- soft and honest tended to diminish bowel sounds. Tender in the epigastrium.   Assessment: 1. Hematemesis. The patient does not really have a strong suggestion of alcoholic liver disease but this is possible. She has not really taken any NSAIDs. More than likely this is gastritis, ulcer, Mallery Weiss tear etc. 2. Pancreatitis. Probably due to her recent history of heavy alcohol ingestion 3. History of alcohol abuse with increased and alcohol intake due to depression during the holidays.  Plan: 1. Continues supportive therapy for pancreatitis. 2. Will plan EGD this afternoon to evaluate for cause of her hematemesis. We have discussed banding the varices, control of bleeding etc. She is agreeable to going ahead.   Raelene Trew JR,Jonnie Truxillo L 02/27/2013, 1:47 PM

## 2013-02-27 NOTE — ED Notes (Signed)
Patient reports to ED for difficulty eating the past few weeks, with no food at all in the past week, with dizziness and weakness for the past four days, chest pain and SOB for a weak with worsening in the past few days, hematemesis starting late yesterday afternoon presenting as large amounts of dark red/brown blood.

## 2013-02-28 DIAGNOSIS — K920 Hematemesis: Secondary | ICD-10-CM

## 2013-02-28 DIAGNOSIS — F1010 Alcohol abuse, uncomplicated: Secondary | ICD-10-CM

## 2013-02-28 DIAGNOSIS — E876 Hypokalemia: Secondary | ICD-10-CM

## 2013-02-28 LAB — BASIC METABOLIC PANEL
BUN: 14 mg/dL (ref 6–23)
CO2: 25 mEq/L (ref 19–32)
CREATININE: 0.6 mg/dL (ref 0.50–1.10)
Calcium: 7.9 mg/dL — ABNORMAL LOW (ref 8.4–10.5)
Chloride: 97 mEq/L (ref 96–112)
GLUCOSE: 104 mg/dL — AB (ref 70–99)
POTASSIUM: 3.7 meq/L (ref 3.7–5.3)
Sodium: 135 mEq/L — ABNORMAL LOW (ref 137–147)

## 2013-02-28 LAB — CBC
HCT: 32.2 % — ABNORMAL LOW (ref 36.0–46.0)
Hemoglobin: 10.8 g/dL — ABNORMAL LOW (ref 12.0–15.0)
MCH: 32 pg (ref 26.0–34.0)
MCHC: 33.5 g/dL (ref 30.0–36.0)
MCV: 95.3 fL (ref 78.0–100.0)
Platelets: 127 10*3/uL — ABNORMAL LOW (ref 150–400)
RBC: 3.38 MIL/uL — ABNORMAL LOW (ref 3.87–5.11)
RDW: 12.8 % (ref 11.5–15.5)
WBC: 8.2 10*3/uL (ref 4.0–10.5)

## 2013-02-28 LAB — URINALYSIS, ROUTINE W REFLEX MICROSCOPIC
Glucose, UA: NEGATIVE mg/dL
Hgb urine dipstick: NEGATIVE
KETONES UR: 15 mg/dL — AB
Leukocytes, UA: NEGATIVE
Nitrite: NEGATIVE
PH: 6 (ref 5.0–8.0)
Protein, ur: NEGATIVE mg/dL
Specific Gravity, Urine: 1.014 (ref 1.005–1.030)
UROBILINOGEN UA: 1 mg/dL (ref 0.0–1.0)

## 2013-02-28 LAB — LIPASE, BLOOD: Lipase: 63 U/L — ABNORMAL HIGH (ref 11–59)

## 2013-02-28 MED ORDER — SODIUM CHLORIDE 0.9 % IV SOLN
INTRAVENOUS | Status: DC
  Administered 2013-02-28: 19:00:00 via INTRAVENOUS
  Administered 2013-02-28: 50 mL/h via INTRAVENOUS

## 2013-02-28 NOTE — Progress Notes (Addendum)
Patient ID: Julie Carson, female   DOB: [birth year], 58 y.o.   MRN: [id number] [facility] Progress Note  Julie Carson 58 y.o. December 30, 1955   Subjective: Tolerating clears but not much appetite. Denies abdominal pain/N/V. Reports ankle pain when walking to bathroom.  Objective: Vital signs in last 24 hours: Filed Vitals:   02/28/13 0347  BP: 118/72  Pulse: 88  Temp: 98.4 F (36.9 C)  Resp: 91   Female nurse tech in room during exam.  Physical Exam: Gen: alert, no acute distress Abd: diffusely tender with guarding, soft, nondistended, +BS Ext: no LE or ankle edema, nontender  Lab Results:  Recent Labs  02/27/13 0958 02/28/13 0555  NA 127* 135*  K 2.7* 3.7  CL 74* 97  CO2 21 25  GLUCOSE 218* 104*  BUN 33* 14  CREATININE 0.98 0.60  CALCIUM 10.9* 7.9*  MG 2.1  --     Recent Labs  02/27/13 0958  AST 58*  ALT 40*  ALKPHOS 113  BILITOT 2.0*  PROT 8.8*  ALBUMIN 4.8    Recent Labs  02/27/13 0958 02/27/13 1808 02/28/13 0555  WBC 16.3*  --  8.2  NEUTROABS 14.4*  --   --   HGB 15.4* 11.9* 10.8*  HCT 42.8 32.5* 32.2*  MCV 92.0  --  95.3  PLT 225  --  127*    Recent Labs  02/27/13 0958  LABPROT 12.7  INR 0.97      Assessment/Plan: 58 yo with alcoholic pancreatitis and GI bleed due to ulcerative esophagitis. Keep on clears. IVFs. IV PPI. Supportive care. Suspect ankle pain due to deconditioning and should resolve with continued ambulation. Advised to let nurse know if weakness or numbness develops.   [place] C. 02/28/2013, 12:20 PM

## 2013-02-28 NOTE — Progress Notes (Signed)
[facility] PROGRESS NOTE    Kyira Volkert NWG:[id number] DOB: 12/16/1955 DOA: 02/27/2013 PCP: Antony Blackbird, MD  HPI/Brief narrative 58 year old female with history of alcohol dependence, major depression, GERD, HTN presented with several days history of nausea, vomiting, abdominal pain and hematemesis but no melena. She was admitted for alcoholic pancreatitis and upper GI bleed. She underwent EGD by GI on 1/9 which showed ulcerative esophagitis.  Assessment/Plan:  Alcoholic pancreatitis - Patient was admitted to the hospital, bowels were arrested and she was placed on IV fluids and pain medications. Abdominal pain has resolved. Start clear liquids and monitor. Lipase improved.  Upper GI bleed secondary to ulcerative esophagitis - Confirmed by EGD on 1/9. Continue IV PPI and sucralfate. - GI followup appreciated and recommend continue clears.  Anemia/thrombocytopenia  - Likely chronic. Hemoglobin of 15.4 on admission was probably from hemoconcentration.  - Follow CBC in a.m. Transfuse if hemoglobin less than 7 g per DL.   History of depression - Patient denies suicidal or homicidal ideations. She says that she has a lot of stressors at home. Request psychiatric consultation.  Hypokalemia - Repleted  Alcohol dependence - No overt features for withdrawal. Continue Ativan protocol  Dehydration with hyponatremia - Improved.   Code Status: Full Family Communication: None at bedside Disposition Plan: Home when medically stable   Consultants:  GI  Procedures:  EGD 1/9  Antibiotics:  None   Subjective: Denies abdominal pain, nausea or vomiting. Asking for food this morning.   Objective: Filed Vitals:   02/27/13 1723 02/27/13 1745 02/27/13 2121 02/28/13 0347  BP: 135/76  118/62 118/72  Pulse: 88  91 88  Temp: 97.7 F (36.5 C)  98.8 F (37.1 C) 98.4 F (36.9 C)  TempSrc: Oral  Oral Oral  Resp: 18  16 18   Height:  5\' 2"  (1.575 m)    Weight: 59.3 kg (130 lb  11.7 oz)     SpO2: 99%  98% 99%    Intake/Output Summary (Last 24 hours) at 02/28/13 1304 Last data filed at 02/28/13 1024  Gross per 24 hour  Intake 2112.5 ml  Output    600 ml  Net 1512.5 ml   Filed Weights   02/27/13 1723  Weight: 59.3 kg (130 lb 11.7 oz)     Exam:  General exam: Young female sitting up comfortably in bed.  Respiratory system: Clear. No increased work of breathing. Cardiovascular system: S1 & S2 heard, RRR. No JVD, murmurs, gallops, clicks or pedal edema. Telemetry: Sinus rhythm  Gastrointestinal system: Abdomen is nondistended, soft and nontender. Normal bowel sounds heard. Central nervous system: Alert and oriented. No focal neurological deficits. Extremities: Symmetric 5 x 5 power.No tremors.    Data Reviewed: Basic Metabolic Panel:  Recent Labs Lab 02/27/13 0958 02/28/13 0555  NA 127* 135*  K 2.7* 3.7  CL 74* 97  CO2 21 25  GLUCOSE 218* 104*  BUN 33* 14  CREATININE 0.98 0.60  CALCIUM 10.9* 7.9*  MG 2.1  --    Liver Function Tests:  Recent Labs Lab 02/27/13 0958  AST 58*  ALT 40*  ALKPHOS 113  BILITOT 2.0*  PROT 8.8*  ALBUMIN 4.8    Recent Labs Lab 02/27/13 0958 02/28/13 0555  LIPASE 204* 63*   No results found for this basename: AMMONIA,  in the last 168 hours CBC:  Recent Labs Lab 02/27/13 0958 02/27/13 1808 02/28/13 0555  WBC 16.3*  --  8.2  NEUTROABS 14.4*  --   --   HGB  15.4* 11.9* 10.8*  HCT 42.8 32.5* 32.2*  MCV 92.0  --  95.3  PLT 225  --  127*   Cardiac Enzymes:  Recent Labs Lab 02/27/13 0958  TROPONINI <0.30   BNP (last 3 results) No results found for this basename: PROBNP,  in the last 8760 hours CBG: No results found for this basename: GLUCAP,  in the last 168 hours  No results found for this or any previous visit (from the past 240 hour(s)).    Studies: Dg Chest 2 View  02/27/2013   CLINICAL DATA:  Chest pain, throwing up blood  EXAM: CHEST  2 VIEW  COMPARISON:  DG CHEST 2 VIEW dated  04/14/2012  FINDINGS: The heart size and mediastinal contours are within normal limits. Both lungs are clear. The visualized skeletal structures are unremarkable.  IMPRESSION: No active cardiopulmonary disease.   Electronically Signed   By: Kathreen Devoid   On: 02/27/2013 10:20        Scheduled Meds: . folic acid  1 mg Oral Daily  . LORazepam  0-4 mg Intravenous Q6H   Followed by  . [START ON 03/01/2013] LORazepam  0-4 mg Intravenous Q12H  . multivitamin with minerals  1 tablet Oral Daily  . pantoprazole (PROTONIX) IV  40 mg Intravenous Q12H  . sodium chloride  3 mL Intravenous Q12H  . sucralfate  1 g Oral TID WC & HS  . thiamine  100 mg Oral Daily   Or  . thiamine  100 mg Intravenous Daily   Continuous Infusions: . sodium chloride 50 mL/hr (02/28/13 1118)    Active Problems:   Hypokalemia   Pancreatitis   Nausea & vomiting   Alcohol abuse   Upper GI bleed   Hyponatremia    Time spent: 35 minutes    Kamar Callender, MD, FACP, FHM. [facility] Pager [phone]  If 7PM-7AM, please contact night-coverage [email or web address] Password [email or web address] 02/28/2013, 1:04 PM    LOS: 1 day

## 2013-03-01 DIAGNOSIS — I517 Cardiomegaly: Secondary | ICD-10-CM

## 2013-03-01 DIAGNOSIS — I472 Ventricular tachycardia: Secondary | ICD-10-CM

## 2013-03-01 DIAGNOSIS — I4729 Other ventricular tachycardia: Secondary | ICD-10-CM | POA: Diagnosis not present

## 2013-03-01 LAB — CBC
HCT: 32.7 % — ABNORMAL LOW (ref 36.0–46.0)
Hemoglobin: 11.5 g/dL — ABNORMAL LOW (ref 12.0–15.0)
MCH: 32.9 pg (ref 26.0–34.0)
MCHC: 35.2 g/dL (ref 30.0–36.0)
MCV: 93.4 fL (ref 78.0–100.0)
Platelets: 142 10*3/uL — ABNORMAL LOW (ref 150–400)
RBC: 3.5 MIL/uL — AB (ref 3.87–5.11)
RDW: 12.6 % (ref 11.5–15.5)
WBC: 6 10*3/uL (ref 4.0–10.5)

## 2013-03-01 LAB — COMPREHENSIVE METABOLIC PANEL
ALT: 28 U/L (ref 0–35)
AST: 44 U/L — ABNORMAL HIGH (ref 0–37)
Albumin: 2.8 g/dL — ABNORMAL LOW (ref 3.5–5.2)
Alkaline Phosphatase: 76 U/L (ref 39–117)
BILIRUBIN TOTAL: 0.9 mg/dL (ref 0.3–1.2)
BUN: 5 mg/dL — ABNORMAL LOW (ref 6–23)
CHLORIDE: 94 meq/L — AB (ref 96–112)
CO2: 32 meq/L (ref 19–32)
CREATININE: 0.53 mg/dL (ref 0.50–1.10)
Calcium: 8 mg/dL — ABNORMAL LOW (ref 8.4–10.5)
Glucose, Bld: 129 mg/dL — ABNORMAL HIGH (ref 70–99)
Potassium: 2.4 mEq/L — CL (ref 3.7–5.3)
Sodium: 135 mEq/L — ABNORMAL LOW (ref 137–147)
Total Protein: 5.5 g/dL — ABNORMAL LOW (ref 6.0–8.3)

## 2013-03-01 LAB — BASIC METABOLIC PANEL
BUN: 4 mg/dL — ABNORMAL LOW (ref 6–23)
CO2: 33 mEq/L — ABNORMAL HIGH (ref 19–32)
Calcium: 8.1 mg/dL — ABNORMAL LOW (ref 8.4–10.5)
Chloride: 93 mEq/L — ABNORMAL LOW (ref 96–112)
Creatinine, Ser: 0.47 mg/dL — ABNORMAL LOW (ref 0.50–1.10)
GFR calc Af Amer: >90 mL/min (ref >90)
GFR calc non Af Amer: >90 mL/min (ref >90)
Glucose, Bld: 110 mg/dL — ABNORMAL HIGH (ref 70–99)
POTASSIUM: 3.1 meq/L — AB (ref 3.7–5.3)
Sodium: 135 mEq/L — ABNORMAL LOW (ref 137–147)

## 2013-03-01 LAB — MAGNESIUM
MAGNESIUM: 2.5 mg/dL (ref 1.5–2.5)
Magnesium: 1.4 mg/dL — ABNORMAL LOW (ref 1.5–2.5)

## 2013-03-01 MED ORDER — POTASSIUM CHLORIDE CRYS ER 20 MEQ PO TBCR
40.0000 meq | EXTENDED_RELEASE_TABLET | ORAL | Status: AC
  Administered 2013-03-01 (×3): 40 meq via ORAL
  Filled 2013-03-01 (×3): qty 2

## 2013-03-01 MED ORDER — BOOST / RESOURCE BREEZE PO LIQD
1.0000 | Freq: Three times a day (TID) | ORAL | Status: DC
  Administered 2013-03-01 – 2013-03-09 (×14): 1 via ORAL

## 2013-03-01 MED ORDER — LORAZEPAM 1 MG PO TABS
1.0000 mg | ORAL_TABLET | Freq: Three times a day (TID) | ORAL | Status: DC
  Administered 2013-03-01 – 2013-03-03 (×6): 1 mg via ORAL
  Filled 2013-03-01 (×5): qty 1

## 2013-03-01 MED ORDER — CITALOPRAM HYDROBROMIDE 10 MG PO TABS
10.0000 mg | ORAL_TABLET | Freq: Every day | ORAL | Status: DC
  Administered 2013-03-01 – 2013-03-04 (×4): 10 mg via ORAL
  Filled 2013-03-01 (×4): qty 1

## 2013-03-01 MED ORDER — MAGNESIUM SULFATE 40 MG/ML IJ SOLN
4.0000 g | Freq: Once | INTRAMUSCULAR | Status: AC
  Administered 2013-03-01: 4 g via INTRAVENOUS
  Filled 2013-03-01: qty 100

## 2013-03-01 NOTE — Progress Notes (Signed)
Pt with 7 beat run of VT.  Slept through it without distress.  Doctor aware no new orders at this time.

## 2013-03-01 NOTE — Progress Notes (Signed)
Patient CIWA is 7. Patient is alert and oriented no obvious distress. Co of nausea. Epigastric pain 6/10- will give prn Zofran. Patient has had coverage for CIWA at 1800 (1mg  po ativan). And 2mg  IVP at 1730. Totaling 3 mg of ativan . Pt BP is 93/58 at this time . Will provide emotional support and reassess discomfort in 1 hour.

## 2013-03-01 NOTE — Progress Notes (Signed)
Echocardiogram 2D Echocardiogram has been performed.  Julie Carson 03/01/2013, 4:11 PM

## 2013-03-01 NOTE — Consult Note (Signed)
[facility] Face-to-Face Psychiatry Consult   Reason for Consult:  Depression Referring Physician:  Dr Margit Hanks is an 58 y.o. female.  Assessment: AXIS I:  Depressive Disorder NOS and Alcohol abuse AXIS II:  Deferred AXIS III:   Past Medical History  Diagnosis Date  . Anxiety   . GERD (gastroesophageal reflux disease)   . PTSD (post-traumatic stress disorder)   . ADHD (attention deficit hyperactivity disorder)   . Migraine   . UTI (urinary tract infection)   . Hypertension   . Allergy   . Lesion of subcutaneous tissue 2013    Deltoid  . Pneumonia   . Animal dander allergy   . Arthritis   . Osteopenia    AXIS IV:  other psychosocial or environmental problems, problems related to social environment and problems with primary support group AXIS V:  51-60 moderate symptoms  Plan:  No evidence of imminent risk to self or others at present.   Patient does not meet criteria for psychiatric inpatient admission. Supportive therapy provided about ongoing stressors. Refer to IOP. Discussed crisis plan, support from social network, calling 911, coming to the Emergency Department, and calling Suicide Hotline.  Subjective:   Julie Carson is a 58 y.o. female patient admitted with nausea vomiting and abdominal pain.  HPI:  Patient seen chart reviewed.  Patient is 58 year old Caucasian female who has history of alcohol dependence, depression, GERD and hypertension admitted because of severe nausea vomiting and abdominal pain.  Psychiatric consult was called because patient requested to see psychiatrist for depression.  Patient endorsed multiple psychosocial issues.  Her father deceased last was this after a prolonged health issues and complication.  Since then she's been taking care of the house and her mother who recently had a stroke and now in [place].  She is trying to contact her 2 brothers who live out of the town to get some help however she is very disappointed from them.   Patient endorsed lately she's been drinking more than usual.  She is not sleeping.  She endorsed crying spells, anhedonia, social isolation and withdrawn.  She denies any active or passive suicidal thought but admitted sometimes feel careless about herself.  She has limited social network.  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  In the past she has taken Celexa which helped her.  She denies any paranoia, hallucination or any mania.  She admitted decreased energy and lack of motivation.  She admitted drinking more than usual to help her depression I like to restart antidepressant.  Patient lives most of her life in many.  She used to go to [employer] however has not gone for many months.  She used to see psychiatrist Dr. Sabra Heck however noncompliant with medication and followups.  Patient does not have any insurance and currently not working.  Her only psychiatric hospitalization was in 2007 and behavioral Center when she was admitted for depression and alcohol abuse.  Patient denies any history of suicidal attempt in the past. HPI Elements:   Location:  Medical floor. Quality:  Fair. Severity:  Mild to moderate.  Past Psychiatric History: Past Medical History  Diagnosis Date  . Anxiety   . GERD (gastroesophageal reflux disease)   . PTSD (post-traumatic stress disorder)   . ADHD (attention deficit hyperactivity disorder)   . Migraine   . UTI (urinary tract infection)   . Hypertension   . Allergy   .  Lesion of subcutaneous tissue 2013    Deltoid  . Pneumonia   . Animal dander allergy   . Arthritis   . Osteopenia     reports that she has been smoking Cigarettes.  She has been smoking about 0.00 packs per day. She has never used smokeless tobacco. She reports that she drinks alcohol. She reports that she does not use illicit drugs. History reviewed. No pertinent family history.   Living Arrangements: Alone   Abuse/Neglect [facility]) Physical Abuse:  Denies Verbal Abuse: Denies Sexual Abuse: Denies Allergies:  No Known Allergies  ACT Assessment Complete:  No:   Past Psychiatric History: Diagnosis:  Depression and alcohol abuse   Hospitalizations:  Yes   Outpatient Care:  None   Substance Abuse Care:  Alcohol   Self-Mutilation:  Denies   Suicidal Attempts:  Denies   Homicidal Behaviors:  Denies    Violent Behaviors:  Denies    Place of Residence:  Lives by herself Marital Status:  Single no children.  Employed/Unemployed:  Unemployed Education:  [employer]:  Limited Objective: Blood pressure 124/73, pulse 82, temperature 98.6 F (37 C), temperature source Oral, resp. rate 16, height 5' 2"  (1.575 m), weight 130 lb 11.7 oz (59.3 kg), SpO2 100.00%.Body mass index is 23.91 kg/(m^2). Results for orders placed during the hospital encounter of 02/27/13 (from the past 72 hour(s))  CBC WITH DIFFERENTIAL     Status: Abnormal   Collection Time    02/27/13  9:58 AM      Result Value Range   WBC 16.3 (*) 4.0 - 10.5 K/uL   RBC 4.65  3.87 - 5.11 MIL/uL   Hemoglobin 15.4 (*) 12.0 - 15.0 g/dL   HCT 42.8  36.0 - 46.0 %   MCV 92.0  78.0 - 100.0 fL   MCH 33.1  26.0 - 34.0 pg   MCHC 36.0  30.0 - 36.0 g/dL   RDW 12.5  11.5 - 15.5 %   Platelets 225  150 - 400 K/uL   Neutrophils Relative % 88 (*) 43 - 77 %   Neutro Abs 14.4 (*) 1.7 - 7.7 K/uL   Lymphocytes Relative 4 (*) 12 - 46 %   Lymphs Abs 0.7  0.7 - 4.0 K/uL   Monocytes Relative 7  3 - 12 %   Monocytes Absolute 1.2 (*) 0.1 - 1.0 K/uL   Eosinophils Relative 0  0 - 5 %   Eosinophils Absolute 0.0  0.0 - 0.7 K/uL   Basophils Relative 0  0 - 1 %   Basophils Absolute 0.0  0.0 - 0.1 K/uL  TYPE AND SCREEN     Status: None   Collection Time    02/27/13  9:58 AM      Result Value Range   ABO/RH(D) A POS     Antibody Screen NEG     Sample Expiration 03/02/2013    COMPREHENSIVE METABOLIC PANEL     Status: Abnormal   Collection Time    02/27/13  9:58 AM      Result Value  Range   Sodium 127 (*) 137 - 147 mEq/L   Comment: REPEATED TO VERIFY   Potassium 2.7 (*) 3.7 - 5.3 mEq/L   Comment: REPEATED TO VERIFY     CRITICAL RESULT CALLED TO, READ BACK BY AND VERIFIED WITH:     DAVIS,N RN AT 1100 01.09.2015 BY LINEBERRY,R   Chloride 74 (*) 96 - 112 mEq/L   Comment: REPEATED TO VERIFY   CO2 21  19 - 32 mEq/L   Comment: REPEATED TO VERIFY   Glucose, Bld 218 (*) 70 - 99 mg/dL   BUN 33 (*) 6 - 23 mg/dL   Creatinine, Ser 0.98  0.50 - 1.10 mg/dL   Calcium 10.9 (*) 8.4 - 10.5 mg/dL   Total Protein 8.8 (*) 6.0 - 8.3 g/dL   Albumin 4.8  3.5 - 5.2 g/dL   AST 58 (*) 0 - 37 U/L   ALT 40 (*) 0 - 35 U/L   Alkaline Phosphatase 113  39 - 117 U/L   Total Bilirubin 2.0 (*) 0.3 - 1.2 mg/dL   GFR calc non Af Amer 62 (*) >90 mL/min   GFR calc Af Amer 72 (*) >90 mL/min   Comment: (NOTE)     The eGFR has been calculated using the CKD EPI equation.     This calculation has not been validated in all clinical situations.     eGFR's persistently <90 mL/min signify possible Chronic Kidney     Disease.  LIPASE, BLOOD     Status: Abnormal   Collection Time    02/27/13  9:58 AM      Result Value Range   Lipase 204 (*) 11 - 59 U/L  TROPONIN I     Status: None   Collection Time    02/27/13  9:58 AM      Result Value Range   Troponin I <0.30  <0.30 ng/mL   Comment:            Due to the release kinetics of cTnI,     a negative result within the first hours     of the onset of symptoms does not rule out     myocardial infarction with certainty.     If myocardial infarction is still suspected,     repeat the test at appropriate intervals.  MAGNESIUM     Status: None   Collection Time    02/27/13  9:58 AM      Result Value Range   Magnesium 2.1  1.5 - 2.5 mg/dL  PROTIME-INR     Status: None   Collection Time    02/27/13  9:58 AM      Result Value Range   Prothrombin Time 12.7  11.6 - 15.2 seconds   INR 0.97  0.00 - 1.49  ABO/RH     Status: None   Collection Time     02/27/13  9:58 AM      Result Value Range   ABO/RH(D) A POS    HEMOGLOBIN AND HEMATOCRIT, BLOOD     Status: Abnormal   Collection Time    02/27/13  6:08 PM      Result Value Range   Hemoglobin 11.9 (*) 12.0 - 15.0 g/dL   Comment: DELTA CHECK NOTED     REPEATED TO VERIFY   HCT 32.5 (*) 36.0 - 46.0 %  URINALYSIS, ROUTINE W REFLEX MICROSCOPIC     Status: Abnormal   Collection Time    02/28/13  4:04 AM      Result Value Range   Color, Urine YELLOW  YELLOW   APPearance CLEAR  CLEAR   Specific Gravity, Urine 1.014  1.005 - 1.030   pH 6.0  5.0 - 8.0   Glucose, UA NEGATIVE  NEGATIVE mg/dL   Hgb urine dipstick NEGATIVE  NEGATIVE   Bilirubin Urine SMALL (*) NEGATIVE   Ketones, ur 15 (*) NEGATIVE mg/dL   Protein, ur NEGATIVE  NEGATIVE mg/dL  Urobilinogen, UA 1.0  0.0 - 1.0 mg/dL   Nitrite NEGATIVE  NEGATIVE   Leukocytes, UA NEGATIVE  NEGATIVE   Comment: MICROSCOPIC NOT DONE ON URINES WITH NEGATIVE PROTEIN, BLOOD, LEUKOCYTES, NITRITE, OR GLUCOSE <1000 mg/dL.  BASIC METABOLIC PANEL     Status: Abnormal   Collection Time    02/28/13  5:55 AM      Result Value Range   Sodium 135 (*) 137 - 147 mEq/L   Comment: DELTA CHECK NOTED     REPEATED TO VERIFY   Potassium 3.7  3.7 - 5.3 mEq/L   Comment: DELTA CHECK NOTED     REPEATED TO VERIFY   Chloride 97  96 - 112 mEq/L   Comment: DELTA CHECK NOTED     REPEATED TO VERIFY   CO2 25  19 - 32 mEq/L   Glucose, Bld 104 (*) 70 - 99 mg/dL   BUN 14  6 - 23 mg/dL   Comment: DELTA CHECK NOTED     REPEATED TO VERIFY   Creatinine, Ser 0.60  0.50 - 1.10 mg/dL   Comment: DELTA CHECK NOTED     REPEATED TO VERIFY   Calcium 7.9 (*) 8.4 - 10.5 mg/dL   GFR calc non Af Amer >90  >90 mL/min   GFR calc Af Amer >90  >90 mL/min   Comment: (NOTE)     The eGFR has been calculated using the CKD EPI equation.     This calculation has not been validated in all clinical situations.     eGFR's persistently <90 mL/min signify possible Chronic Kidney     Disease.   CBC     Status: Abnormal   Collection Time    02/28/13  5:55 AM      Result Value Range   WBC 8.2  4.0 - 10.5 K/uL   RBC 3.38 (*) 3.87 - 5.11 MIL/uL   Hemoglobin 10.8 (*) 12.0 - 15.0 g/dL   HCT 32.2 (*) 36.0 - 46.0 %   MCV 95.3  78.0 - 100.0 fL   MCH 32.0  26.0 - 34.0 pg   MCHC 33.5  30.0 - 36.0 g/dL   RDW 12.8  11.5 - 15.5 %   Platelets 127 (*) 150 - 400 K/uL   Comment: DELTA CHECK NOTED     REPEATED TO VERIFY     SPECIMEN CHECKED FOR CLOTS     PLATELET COUNT CONFIRMED BY SMEAR  LIPASE, BLOOD     Status: Abnormal   Collection Time    02/28/13  5:55 AM      Result Value Range   Lipase 63 (*) 11 - 59 U/L  CBC     Status: Abnormal   Collection Time    03/01/13  6:11 AM      Result Value Range   WBC 6.0  4.0 - 10.5 K/uL   RBC 3.50 (*) 3.87 - 5.11 MIL/uL   Hemoglobin 11.5 (*) 12.0 - 15.0 g/dL   HCT 32.7 (*) 36.0 - 46.0 %   MCV 93.4  78.0 - 100.0 fL   MCH 32.9  26.0 - 34.0 pg   MCHC 35.2  30.0 - 36.0 g/dL   RDW 12.6  11.5 - 15.5 %   Platelets 142 (*) 150 - 400 K/uL  COMPREHENSIVE METABOLIC PANEL     Status: Abnormal   Collection Time    03/01/13  6:11 AM      Result Value Range   Sodium 135 (*) 137 - 147 mEq/L  Potassium 2.4 (*) 3.7 - 5.3 mEq/L   Comment: DELTA CHECK NOTED     REPEATED TO VERIFY     CRITICAL RESULT CALLED TO, READ BACK BY AND VERIFIED WITH:     JACKSON,V/5E @0703  ON 03/01/13 BY KARCZEWSKI,S.   Chloride 94 (*) 96 - 112 mEq/L   CO2 32  19 - 32 mEq/L   Glucose, Bld 129 (*) 70 - 99 mg/dL   BUN 5 (*) 6 - 23 mg/dL   Creatinine, Ser 0.53  0.50 - 1.10 mg/dL   Calcium 8.0 (*) 8.4 - 10.5 mg/dL   Total Protein 5.5 (*) 6.0 - 8.3 g/dL   Albumin 2.8 (*) 3.5 - 5.2 g/dL   AST 44 (*) 0 - 37 U/L   ALT 28  0 - 35 U/L   Alkaline Phosphatase 76  39 - 117 U/L   Total Bilirubin 0.9  0.3 - 1.2 mg/dL   GFR calc non Af Amer >90  >90 mL/min   GFR calc Af Amer >90  >90 mL/min   Comment: (NOTE)     The eGFR has been calculated using the CKD EPI equation.     This  calculation has not been validated in all clinical situations.     eGFR's persistently <90 mL/min signify possible Chronic Kidney     Disease.  MAGNESIUM     Status: Abnormal   Collection Time    03/01/13  6:11 AM      Result Value Range   Magnesium 1.4 (*) 1.5 - 2.5 mg/dL   Labs are reviewed and are pertinent for low potassium.  Current Facility-Administered Medications  Medication Dose Route Frequency Provider Last Rate Last Dose  . 0.9 %  sodium chloride infusion   Intravenous Continuous Modena Jansky, MD 50 mL/hr at 03/01/13 0600    . acetaminophen (TYLENOL) tablet 650 mg  650 mg Oral Q6H PRN Kelvin Cellar, MD       Or  . acetaminophen (TYLENOL) suppository 650 mg  650 mg Rectal Q6H PRN Kelvin Cellar, MD      . feeding supplement (RESOURCE BREEZE) (RESOURCE BREEZE) liquid 1 Container  1 Container Oral TID BM Dagmar Hait, RD      . folic acid (FOLVITE) tablet 1 mg  1 mg Oral Daily Kelvin Cellar, MD   1 mg at 03/01/13 1018  . LORazepam (ATIVAN) injection 0-4 mg  0-4 mg Intravenous Q6H Kelvin Cellar, MD   2 mg at 03/01/13 1258   Followed by  . LORazepam (ATIVAN) injection 0-4 mg  0-4 mg Intravenous Q12H Kelvin Cellar, MD      . LORazepam (ATIVAN) tablet 1 mg  1 mg Oral Q6H PRN Kelvin Cellar, MD   1 mg at 02/28/13 2234   Or  . LORazepam (ATIVAN) injection 1 mg  1 mg Intravenous Q6H PRN Kelvin Cellar, MD   1 mg at 03/01/13 1018  . morphine 2 MG/ML injection 2 mg  2 mg Intravenous Q4H PRN Kelvin Cellar, MD   2 mg at 03/01/13 0845  . multivitamin with minerals tablet 1 tablet  1 tablet Oral Daily Kelvin Cellar, MD   1 tablet at 03/01/13 1018  . ondansetron (ZOFRAN) tablet 4 mg  4 mg Oral Q6H PRN Kelvin Cellar, MD       Or  . ondansetron (ZOFRAN) injection 4 mg  4 mg Intravenous Q6H PRN Kelvin Cellar, MD      . oxyCODONE (Oxy IR/ROXICODONE) immediate release tablet 5 mg  5 mg  Oral Q4H PRN Kelvin Cellar, MD   5 mg at 03/01/13 1256  . pantoprazole (PROTONIX)  injection 40 mg  40 mg Intravenous Q12H Winfield Cunas., MD   40 mg at 03/01/13 1018  . potassium chloride SA (K-DUR,KLOR-CON) CR tablet 40 mEq  40 mEq Oral Q4H Modena Jansky, MD   40 mEq at 03/01/13 1257  . sodium chloride 0.9 % injection 3 mL  3 mL Intravenous Q12H Kelvin Cellar, MD   3 mL at 02/28/13 2200  . sucralfate (CARAFATE) 1 GM/10ML suspension 1 g  1 g Oral TID WC & HS Winfield Cunas., MD   1 g at 03/01/13 1257  . thiamine (VITAMIN B-1) tablet 100 mg  100 mg Oral Daily Kelvin Cellar, MD   100 mg at 03/01/13 1018   Or  . thiamine (B-1) injection 100 mg  100 mg Intravenous Daily Kelvin Cellar, MD        Psychiatric Specialty Exam:     Blood pressure 124/73, pulse 82, temperature 98.6 F (37 C), temperature source Oral, resp. rate 16, height 5' 2"  (1.575 m), weight 130 lb 11.7 oz (59.3 kg), SpO2 100.00%.Body mass index is 23.91 kg/(m^2).  General Appearance: Casual  Eye Contact::  Fair  Speech:  Slow  Volume:  Decreased  Mood:  Depressed and Dysphoric  Affect:  Congruent, Constricted and Depressed  Thought Process:  Linear and Logical  Orientation:  Full (Time, Place, and Person)  Thought Content:  Rumination  Suicidal Thoughts:  No  Homicidal Thoughts:  No  Memory:  Immediate;   Good Recent;   Good Remote;   Good  Judgement:  Fair  Insight:  Fair  Psychomotor Activity:  Decreased  Concentration:  Fair  Recall:  Fair  Akathisia:  No  Handed:  Right  AIMS (if indicated):     Assets:  Communication Skills Desire for Improvement Housing  Sleep:      Treatment Plan Summary: Medication management Start Celexa 10 mg and gradually increased to 20 mg if not medically contraindicated.  Patient requires CD IOP program for her alcohol abuse and counseling her coping skills.  Patient does not want to admit to a psych hospital.  She is getting Ativan to prevent alcohol withdrawal symptoms.  Recommended AA meetings upon discharge.  Please call [phone] if you have  any further questions.  Psychiatry will follow the patient if needed.  Salih Williamson T. 03/01/2013 1:08 PM

## 2013-03-01 NOTE — Progress Notes (Signed)
INITIAL NUTRITION ASSESSMENT  DOCUMENTATION CODES Per approved criteria  -Not Applicable   INTERVENTION: - Resource Breeze po TID, each supplement provides 250 kcal and 9 grams of protein - Once diet upgraded, d/c Resource Breeze and add Ensure Complete po BID, each supplement provides 350 kcal and 13 grams of protein - Diet advancement per MD discretion  NUTRITION DIAGNOSIS: Inadequate oral intake related to pancreatitis as evidenced by clear liquid diet and suspected wt loss.   Goal: Pt to meet >/= 90% of their estimated nutrition needs   Monitor:  Wt, po intake, labs, diet advancement  Reason for Assessment: MST  58 y.o. female  Admitting Dx: <principal problem not specified>  ASSESSMENT: 58 y.o. female with a past medical history of alcohol abuse with history of alcohol withdrawal, major depression Who presents to the emergency department with complaints of severe nausea/vomiting with associated abdominal pain.  Pt found to have pancreatitis likely related to alcohol use. Pt reports that she feels hungry and that she is concerned that she has not been getting enough nutrition. She suspects that she has lost some weight, but was unable to tell RD her usual body weight. Pt would like to have nutritional supplements. She says that her appetite has been poor for about a month.   Height: Ht Readings from Last 1 Encounters:  02/27/13 5\' 2"  (1.575 m)    Weight: Wt Readings from Last 1 Encounters:  02/27/13 130 lb 11.7 oz (59.3 kg)    Ideal Body Weight: 50.1 kg  % Ideal Body Weight: 118%  Wt Readings from Last 10 Encounters:  02/27/13 130 lb 11.7 oz (59.3 kg)  02/27/13 130 lb 11.7 oz (59.3 kg)  04/16/12 136 lb 0.4 oz (61.7 kg)  11/09/11 136 lb (61.689 kg)  10/17/11 139 lb (63.05 kg)  10/02/11 137 lb (62.143 kg)    Usual Body Weight: unknown  % Usual Body Weight: unknown  BMI:  Body mass index is 23.91 kg/(m^2).  Estimated Nutritional Needs: Kcal:  1500-1800 Protein: 70-85 g Fluid: >/= 1.8 L/day  Skin: WNL  Diet Order: Clear Liquid  EDUCATION NEEDS: -No education needs identified at this time   Intake/Output Summary (Last 24 hours) at 03/01/13 1238 Last data filed at 03/01/13 0608  Gross per 24 hour  Intake 1004.8 ml  Output   2050 ml  Net -1045.2 ml    Last BM: PTA   Labs:   Recent Labs Lab 02/27/13 0958 02/28/13 0555 03/01/13 0611  NA 127* 135* 135*  K 2.7* 3.7 2.4*  CL 74* 97 94*  CO2 21 25 32  BUN 33* 14 5*  CREATININE 0.98 0.60 0.53  CALCIUM 10.9* 7.9* 8.0*  MG 2.1  --  1.4*  GLUCOSE 218* 104* 129*    CBG (last 3)  No results found for this basename: GLUCAP,  in the last 72 hours  Scheduled Meds: . folic acid  1 mg Oral Daily  . LORazepam  0-4 mg Intravenous Q6H   Followed by  . LORazepam  0-4 mg Intravenous Q12H  . magnesium sulfate 1 - 4 g bolus IVPB  4 g Intravenous Once  . multivitamin with minerals  1 tablet Oral Daily  . pantoprazole (PROTONIX) IV  40 mg Intravenous Q12H  . potassium chloride  40 mEq Oral Q4H  . sodium chloride  3 mL Intravenous Q12H  . sucralfate  1 g Oral TID WC & HS  . thiamine  100 mg Oral Daily   Or  . thiamine  100 mg Intravenous Daily    Continuous Infusions: . sodium chloride 50 mL/hr at 03/01/13 0600    Past Medical History  Diagnosis Date  . Anxiety   . GERD (gastroesophageal reflux disease)   . PTSD (post-traumatic stress disorder)   . ADHD (attention deficit hyperactivity disorder)   . Migraine   . UTI (urinary tract infection)   . Hypertension   . Allergy   . Lesion of subcutaneous tissue 2013    Deltoid  . Pneumonia   . Animal dander allergy   . Arthritis   . Osteopenia     Past Surgical History  Procedure Laterality Date  . Breast enhancement surgery    . Liposuction    . Basal cell carcinoma excision      L neck and shoulder  . Nose surgery    . Mass excision  10/24/2011    Procedure: EXCISION MASS;  Surgeon: Leighton Ruff, MD;   Location: [place];  Service: General;  Laterality: Left;    Terrace Arabia RD, LDN

## 2013-03-01 NOTE — Progress Notes (Signed)
[facility] PROGRESS NOTE    Julie Carson IDP:[id number] DOB: [birth year] DOA: 02/27/2013 PCP: Antony Blackbird, MD  HPI/Brief narrative 58 year old female with history of alcohol dependence, major depression, GERD, HTN presented with several days history of nausea, vomiting, abdominal pain and hematemesis but no melena. She was admitted for alcoholic pancreatitis and upper GI bleed. She underwent EGD by GI on 1/9 which showed ulcerative esophagitis.  Assessment/Plan:  Alcoholic pancreatitis - Patient was admitted to the hospital, bowels were rested and she was placed on IV fluids and pain medications. Abdominal pain has improved. Started clear liquids -tolerating. Lipase improved. Advance diet when cleared by GI.  Upper GI bleed secondary to ulcerative esophagitis - Confirmed by EGD on 1/9. Continue IV PPI and sucralfate. - GI followup appreciated and recommend continue clears-tolerating-advance diet when cleared by GI.  Hypokalemia/hypomagnesemia -Replace aggressively and follow in a.m.  NSVT x1 - Likely secondary to electrolyte abnormalities. Continue monitoring on telemetry. Replace potassium and magnesium. Check 2-D echo.   Anemia/thrombocytopenia  - Likely chronic. Hemoglobin of 15.4 on admission was probably from hemoconcentration.  - Stable. Transfuse if hemoglobin less than 7 g per DL.   History of depression - Patient denies suicidal or homicidal ideations. She says that she has a lot of stressors at home. Psychiatric consultation appreciated-start Celexa.  Alcohol dependence & withdrawal - Continue Ativan protocol  Dehydration with hyponatremia - Improved.   Code Status: Full Family Communication: None at bedside Disposition Plan: Home when medically stable   Consultants:  GI  Psychiatry  Procedures:  EGD 1/9  Antibiotics:  None   Subjective:  patient complains of some substernal pain/discomfort while eating. Feels anxious and tremulous. No nausea  or vomiting. Tolerating clear liquids. No BM.   Objective: Filed Vitals:   02/28/13 2158 03/01/13 0007 03/01/13 0600 03/01/13 0608  BP: 126/73 117/84 124/73 124/73  Pulse: 85 78 82 82  Temp: 98.3 F (36.8 C) 98.2 F (36.8 C)  98.6 F (37 C)  TempSrc: Oral Oral  Oral  Resp: 18 18  16   Height:      Weight:      SpO2: 100% 100%  100%    Intake/Output Summary (Last 24 hours) at 03/01/13 1355 Last data filed at 03/01/13 4431  Gross per 24 hour  Intake 1004.8 ml  Output   2050 ml  Net -1045.2 ml   Filed Weights   02/27/13 1723  Weight: 59.3 kg (130 lb 11.7 oz)     Exam:  General exam: Young female sitting up comfortably in bed. Anxious and slightly tremulous.  Respiratory system: Clear. No increased work of breathing. Cardiovascular system: S1 & S2 heard, RRR. No JVD, murmurs, gallops, clicks or pedal edema. Telemetry: Sinus rhythm. An episode of 8 beat NSVT x1 at 2:20 AM  Gastrointestinal system: Abdomen is nondistended, soft and nontender. Normal bowel sounds heard. Central nervous system: Alert and oriented. No focal neurological deficits. Extremities: Symmetric 5 x 5 power.No tremors.    Data Reviewed: Basic Metabolic Panel:  Recent Labs Lab 02/27/13 0958 02/28/13 0555 03/01/13 0611  NA 127* 135* 135*  K 2.7* 3.7 2.4*  CL 74* 97 94*  CO2 21 25 32  GLUCOSE 218* 104* 129*  BUN 33* 14 5*  CREATININE 0.98 0.60 0.53  CALCIUM 10.9* 7.9* 8.0*  MG 2.1  --  1.4*   Liver Function Tests:  Recent Labs Lab 02/27/13 0958 03/01/13 0611  AST 58* 44*  ALT 40* 28  ALKPHOS 113 76  BILITOT 2.0*  0.9  PROT 8.8* 5.5*  ALBUMIN 4.8 2.8*    Recent Labs Lab 02/27/13 0958 02/28/13 0555  LIPASE 204* 63*   No results found for this basename: AMMONIA,  in the last 168 hours CBC:  Recent Labs Lab 02/27/13 0958 02/27/13 1808 02/28/13 0555 03/01/13 0611  WBC 16.3*  --  8.2 6.0  NEUTROABS 14.4*  --   --   --   HGB 15.4* 11.9* 10.8* 11.5*  HCT 42.8 32.5* 32.2* 32.7*   MCV 92.0  --  95.3 93.4  PLT 225  --  127* 142*   Cardiac Enzymes:  Recent Labs Lab 02/27/13 0958  TROPONINI <0.30   BNP (last 3 results) No results found for this basename: PROBNP,  in the last 8760 hours CBG: No results found for this basename: GLUCAP,  in the last 168 hours  No results found for this or any previous visit (from the past 240 hour(s)).    Studies: No results found.      Scheduled Meds: . feeding supplement (RESOURCE BREEZE)  1 Container Oral TID BM  . folic acid  1 mg Oral Daily  . LORazepam  0-4 mg Intravenous Q6H   Followed by  . LORazepam  0-4 mg Intravenous Q12H  . multivitamin with minerals  1 tablet Oral Daily  . pantoprazole (PROTONIX) IV  40 mg Intravenous Q12H  . potassium chloride  40 mEq Oral Q4H  . sodium chloride  3 mL Intravenous Q12H  . sucralfate  1 g Oral TID WC & HS  . thiamine  100 mg Oral Daily   Or  . thiamine  100 mg Intravenous Daily   Continuous Infusions: . sodium chloride 50 mL/hr at 03/01/13 0600    Active Problems:   Hypokalemia   Pancreatitis   Nausea & vomiting   Alcohol abuse   Upper GI bleed   Hyponatremia    Time spent: 44 minutes    [name], MD, FACP, FHM. [facility] Pager [phone]  If 7PM-7AM, please contact night-coverage [email or web address] Password [email or web address] 03/01/2013, 1:55 PM    LOS: 2 days

## 2013-03-01 NOTE — Progress Notes (Signed)
Patient ID: Julie Carson, female   DOB: 23-Mar-1955, 58 y.o.   MRN: [id number] Julie [facility] Progress Note  Julie Carson 58 y.o. [birth year]   Subjective: Denies N/V. Tolerating clears ok. Pain continuing.  Objective: Vital signs in last 24 hours: Filed Vitals:   03/01/13 0608  BP: 124/73  Pulse: 82  Temp: 98.6 F (37 C)  Resp: 16    Physical Exam: Gen: alert, mild acute distress Abd: diffusely tender with guarding (less tender than yesterday), soft, ND, +BS  Lab Results:  Recent Labs  02/27/13 0958 02/28/13 0555 03/01/13 0611  NA 127* 135* 135*  K 2.7* 3.7 2.4*  CL 74* 97 94*  CO2 21 25 32  GLUCOSE 218* 104* 129*  BUN 33* 14 5*  CREATININE 0.98 0.60 0.53  CALCIUM 10.9* 7.9* 8.0*  MG 2.1  --  1.4*    Recent Labs  02/27/13 0958 03/01/13 0611  AST 58* 44*  ALT 40* 28  ALKPHOS 113 76  BILITOT 2.0* 0.9  PROT 8.8* 5.5*  ALBUMIN 4.8 2.8*    Recent Labs  02/27/13 0958  02/28/13 0555 03/01/13 0611  WBC 16.3*  --  8.2 6.0  NEUTROABS 14.4*  --   --   --   HGB 15.4*  < > 10.8* 11.5*  HCT 42.8  < > 32.2* 32.7*  MCV 92.0  --  95.3 93.4  PLT 225  --  127* 142*  < > = values in this interval not displayed.  Recent Labs  02/27/13 0958  LABPROT 12.7  INR 0.97      Assessment/Plan: Pancreatitis likely due to alcohol - Continue IVFs, replace electrolytes per TR[facility], Would not advance beyond clears right now, supportive care, On alcohol withdrawal protocol (pt feels anxious and "nervous" and she attributes it to alcohol). Continue IV PPI Q 12hours. Continue Carafate. Will follow.   Julie Carson C. 03/01/2013, 9:20 AM

## 2013-03-02 LAB — BASIC METABOLIC PANEL
BUN: 3 mg/dL — ABNORMAL LOW (ref 6–23)
CALCIUM: 7.7 mg/dL — AB (ref 8.4–10.5)
CHLORIDE: 97 meq/L (ref 96–112)
CO2: 29 meq/L (ref 19–32)
CREATININE: 0.43 mg/dL — AB (ref 0.50–1.10)
GFR calc Af Amer: >90 mL/min (ref >90)
GFR calc non Af Amer: >90 mL/min (ref >90)
GLUCOSE: 130 mg/dL — AB (ref 70–99)
Potassium: 3.1 mEq/L — ABNORMAL LOW (ref 3.7–5.3)
Sodium: 135 mEq/L — ABNORMAL LOW (ref 137–147)

## 2013-03-02 MED ORDER — POTASSIUM CHLORIDE IN NACL 20-0.9 MEQ/L-% IV SOLN
INTRAVENOUS | Status: DC
  Administered 2013-03-02 – 2013-03-09 (×14): via INTRAVENOUS
  Filled 2013-03-02 (×19): qty 1000

## 2013-03-02 MED ORDER — POTASSIUM CHLORIDE CRYS ER 20 MEQ PO TBCR
40.0000 meq | EXTENDED_RELEASE_TABLET | ORAL | Status: AC
  Administered 2013-03-02 (×2): 40 meq via ORAL
  Filled 2013-03-02 (×2): qty 2

## 2013-03-02 NOTE — Progress Notes (Signed)
Clinical Social Work Department CLINICAL SOCIAL WORK PSYCHIATRY SERVICE LINE ASSESSMENT 03/02/2013  Patient:  Julie Carson  Account:  [id number]  Admit Date:  02/27/2013  Clinical Social Worker:  Sindy Messing, LCSW  Date/Time:  03/02/2013 02:30 PM Referred by:  Physician  Date referred:  03/02/2013 Reason for Referral  Psychosocial assessment   Presenting Symptoms/Problems (In the person's/family's own words):   Psych consulted due to depression and substance use   Abuse/Neglect/Trauma History (check all that apply)  Denies history   Abuse/Neglect/Trauma Comments:   N/A   Psychiatric History (check all that apply)  Outpatient treatment   Psychiatric medications:  Celexa 10 mg   Current Mental Health Hospitalizations/Previous Mental Health History:   Patient reports she was diagnosed with depression several years ago. Patient reports that she had friends that were pharmaceutical representatives that would give her antidepressants to try. Patient reports no current treatment.   Current provider:   None   Place and Date:   N/A   Current Medications:   Scheduled Meds:      . citalopram  10 mg Oral Daily  . feeding supplement (RESOURCE BREEZE)  1 Container Oral TID BM  . folic acid  1 mg Oral Daily  . LORazepam  0-4 mg Intravenous Q12H  . LORazepam  1 mg Oral TID  . multivitamin with minerals  1 tablet Oral Daily  . pantoprazole (PROTONIX) IV  40 mg Intravenous Q12H  . sodium chloride  3 mL Intravenous Q12H  . sucralfate  1 g Oral TID WC & HS  . thiamine  100 mg Oral Daily   Or     . thiamine  100 mg Intravenous Daily        Continuous Infusions:      . 0.9 % NaCl with KCl 20 mEq / L 100 mL/hr at 03/02/13 1247          PRN Meds:.acetaminophen, acetaminophen, LORazepam, LORazepam, morphine injection, ondansetron (ZOFRAN) IV, ondansetron, oxyCODONE       Previous Impatient Admission/Date/Reason:   None reported   Emotional Health / Current Symptoms    Suicide/Self  Harm  None reported   Suicide attempt in the past:   Patient denies any SI or HI. Patient denies any previous suicide attempts and reports she would never harm herself because she has had friends that committed suicide and she would not want to be selfish.   Other harmful behavior:   None reported   Psychotic/Dissociative Symptoms  None reported   Other Psychotic/Dissociative Symptoms:   N/A    Attention/Behavioral Symptoms  Within Normal Limits   Other Attention / Behavioral Symptoms:   Patient engaged throughout assessment.    Cognitive Impairment  Within Normal Limits   Other Cognitive Impairment:   Patient alert and oriented.    Mood and Adjustment  Flat    Stress, Anxiety, Trauma, Any Recent Loss/Stressor  Relationship  Grief/Loss (recent or history)   Anxiety (frequency):   N/A   Phobia (specify):   N/A   Compulsive behavior (specify):   N/A   Obsessive behavior (specify):   N/A   Other:   Patient reports dad passed away a few years ago on Christmas Eve. Patient states that she is currently trying to care for her mother and is stressed about her care.   Substance Abuse/Use  Current substance use   SBIRT completed (please refer for detailed history):  Y  Self-reported substance use:   Patient reports she started drinking heavily after father passed  away. Patient reports she is drinking on a daily basis and 2-3 mixed drinks that are very large. Patient denies any blackouts or DTs but reports she feels addicted to alcohol and uses alcohol as a coping mechanism. Patient reports she has completed [facility] program about 15 years ago and currently attends AA meetings.   Urinary Drug Screen Completed:  N Alcohol level:   N/A    Environmental/Housing/Living Arrangement  Stable housing   Who is in the home:   Alone   Emergency contact:  [place]   Patient's Strengths and Goals (patient's own words):   Patient reports that  she is motivated to stop drinking and understands the danger she has put herself in by drinking heavily.   Clinical Social Worker's Interpretive Summary:   CSW received referral to complete psychosocial assessment. CSW reviewed chart and met with patient at bedside. Per chart review, psych MD recommends outpatient follow up at [facility].    CSW met with patient at bedside. CSW introduced myself and explained role. Patient reports that she was admitted after she started vomiting blood. Patient reports increasing depression which led to further alcohol abuse which caused her to vomit blood. Patient is able to identify triggers of depression and alcohol abuse such as father passing away, mother's declining illness, being unemployed, financial stress, and relationship problems with siblings. Patient reports symptoms of depression such as feeling more irritable and hopeless, isolating more often, decrease in appetite and increased sleeping habits. Patient feels when she is more depressed that she drinks more alcohol.    Patient reports no current follow up treatment. Patient attends [facility] meetings periodically but does not have a PCP or a psychiatrist. Patient reports that she does not have insurance and although she is aware that treatment is needed she is unsure of resources. CSW provided referral to [facility] for [facility] IOP. Patient went to IOP there about 15 years ago and would like to return. CSW provided scholarship information along with flyer if she has additional questions. CSW also provided information for [facility] if she wanted to follow up with a psychiatrist. Patient is motivated to stop drinking and aware that MD is concerned about medical complications if she continues to drink. Patient feels with additional support that she can remain sober.    Patient engaged during assessment. Patient tearful at times with depressed affect. Patient maintains appropriate eye contact and is receptive to completing assessment. Patient  feels that recent stress has contributed to increased Helen and SA concerns. Patient agreeable to follow up and thanked CSW for time.   Disposition:  Outpatient referral made/needed  [place], [place] [phone]

## 2013-03-02 NOTE — Progress Notes (Signed)
Patient ID: Julie Carson, female   DOB: 26-Jul-1955, 58 y.o.   MRN: [id number] [facility] Progress Note  Julie Carson 58 y.o. 1956-02-17   Subjective: Restless in bed. Feels anxious. Denies abdominal pain/N/V. Occasional sore throat. Wants to eat.  Objective: Vital signs in last 24 hours: Filed Vitals:   03/02/13 0541  BP: 116/78  Pulse: 83  Temp: 98.6 F (37 C)  Resp: 18    Physical Exam: Gen: alert, no acute distress Abd: diffusely tender with minimal guarding, soft, nondistended, +BS  Lab Results:  Recent Labs  03/01/13 0611 03/01/13 1805 03/02/13 0525  NA 135* 135* 135*  K 2.4* 3.1* 3.1*  CL 94* 93* 97  CO2 32 33* 29  GLUCOSE 129* 110* 130*  BUN 5* 4* 3*  CREATININE 0.53 0.47* 0.43*  CALCIUM 8.0* 8.1* 7.7*  MG 1.4* 2.5  --     Recent Labs  03/01/13 0611  AST 44*  ALT 28  ALKPHOS 76  BILITOT 0.9  PROT 5.5*  ALBUMIN 2.8*    Recent Labs  02/28/13 0555 03/01/13 0611  WBC 8.2 6.0  HGB 10.8* 11.5*  HCT 32.2* 32.7*  MCV 95.3 93.4  PLT 127* 142*   No results found for this basename: LABPROT, INR,  in the last 72 hours    Assessment/Plan: Resolving acute pancreatitis and recent diagnosis of ulcerative esophagitis. Will change to full liquids and advance as tolerated to low fat diet. Continue IVFs. Replace electrolytes per primary team. Will follow.   [place] C. 03/02/2013, 12:07 PM

## 2013-03-02 NOTE — Progress Notes (Signed)
[facility] PROGRESS NOTE    Julie Carson IRJ:[id number] DOB: 1955/06/07 DOA: 02/27/2013 PCP: Antony Blackbird, MD  HPI/Brief narrative 58 year old female with history of alcohol dependence, major depression, GERD, HTN presented with several days history of nausea, vomiting, abdominal pain and hematemesis but no melena. She was admitted for alcoholic pancreatitis and upper GI bleed. She underwent EGD by GI on 1/9 which showed ulcerative esophagitis.  Assessment/Plan:  Alcoholic pancreatitis - Patient was admitted to the hospital, bowels were rested and she was placed on IV fluids and pain medications. Abdominal pain has improved. Started clear liquids -tolerating. Lipase improved. Advance diet as per GI.  Upper GI bleed secondary to ulcerative esophagitis - Confirmed by EGD on 1/9. Continue IV PPI and sucralfate. - GI followup appreciated and recommend continue clears-tolerating-advance diet as per GI.  Hypokalemia/hypomagnesemia -Replaced magnesium. Aggressively replace potassium and follow up in a.m.  NSVT x1 - Likely secondary to electrolyte abnormalities. Continue monitoring on telemetry. Replace potassium and magnesium. 2-D echo: LVEF 60-65%, mild LVH and second grade 1 diastolic dysfunction. Also suggest hypovolemia-increase IV fluids..   Anemia/thrombocytopenia  - Likely chronic. Hemoglobin of 15.4 on admission was probably from hemoconcentration.  - Stable. Transfuse if hemoglobin less than 7 g per DL.   History of depression - Patient denies suicidal or homicidal ideations. She says that she has a lot of stressors at home. Psychiatric consultation appreciated-started Celexa 1/11.  Alcohol dependence & withdrawal - Continue Ativan protocol. Added scheduled Ativan 1/11 secondary to persistent symptoms despite Ativan protocol.  Dehydration with hyponatremia - Improved.   Code Status: Full Family Communication: None at bedside Disposition Plan: Home when medically  stable   Consultants:  GI  Psychiatry  Procedures:  EGD 1/9  Antibiotics:  None   Subjective: Patient continues to complain of some substernal pain/discomfort while eating but is tolerating clear liquids. Eager to eat something solid. No BM.    Objective: Filed Vitals:   03/01/13 2207 03/01/13 2300 03/02/13 0219 03/02/13 0541  BP: 110/62 103/66 128/86 116/78  Pulse: 79 83 80 83  Temp: 98.9 F (37.2 C)   98.6 F (37 C)  TempSrc: Oral   Oral  Resp: 18   18  Height:      Weight:      SpO2: 95%   97%    Intake/Output Summary (Last 24 hours) at 03/02/13 1145 Last data filed at 03/02/13 1025  Gross per 24 hour  Intake    540 ml  Output   2200 ml  Net  -1660 ml   Filed Weights   02/27/13 1723  Weight: 59.3 kg (130 lb 11.7 oz)     Exam:  General exam: Young female sitting up comfortably in bed. Appears less anxious. Respiratory system: Clear. No increased work of breathing. Cardiovascular system: S1 & S2 heard, RRR. No JVD, murmurs, gallops, clicks or pedal edema. Telemetry: Sinus rhythm. No further episodes of NSVT.  Gastrointestinal system: Abdomen is nondistended, soft. Mild epigastric tenderness without peritoneal signs. Normal bowel sounds heard. Central nervous system: Alert and oriented. No focal neurological deficits. Extremities: Symmetric 5 x 5 power.No tremors.    Data Reviewed: Basic Metabolic Panel:  Recent Labs Lab 02/27/13 0958 02/28/13 0555 03/01/13 0611 03/01/13 1805 03/02/13 0525  NA 127* 135* 135* 135* 135*  K 2.7* 3.7 2.4* 3.1* 3.1*  CL 74* 97 94* 93* 97  CO2 21 25 32 33* 29  GLUCOSE 218* 104* 129* 110* 130*  BUN 33* 14 5* 4* 3*  CREATININE  0.98 0.60 0.53 0.47* 0.43*  CALCIUM 10.9* 7.9* 8.0* 8.1* 7.7*  MG 2.1  --  1.4* 2.5  --    Liver Function Tests:  Recent Labs Lab 02/27/13 0958 03/01/13 0611  AST 58* 44*  ALT 40* 28  ALKPHOS 113 76  BILITOT 2.0* 0.9  PROT 8.8* 5.5*  ALBUMIN 4.8 2.8*    Recent Labs Lab  02/27/13 0958 02/28/13 0555  LIPASE 204* 63*   No results found for this basename: AMMONIA,  in the last 168 hours CBC:  Recent Labs Lab 02/27/13 0958 02/27/13 1808 02/28/13 0555 03/01/13 0611  WBC 16.3*  --  8.2 6.0  NEUTROABS 14.4*  --   --   --   HGB 15.4* 11.9* 10.8* 11.5*  HCT 42.8 32.5* 32.2* 32.7*  MCV 92.0  --  95.3 93.4  PLT 225  --  127* 142*   Cardiac Enzymes:  Recent Labs Lab 02/27/13 0958  TROPONINI <0.30   BNP (last 3 results) No results found for this basename: PROBNP,  in the last 8760 hours CBG: No results found for this basename: GLUCAP,  in the last 168 hours  No results found for this or any previous visit (from the past 240 hour(s)).    Studies: No results found.      Scheduled Meds: . citalopram  10 mg Oral Daily  . feeding supplement (RESOURCE BREEZE)  1 Container Oral TID BM  . folic acid  1 mg Oral Daily  . LORazepam  0-4 mg Intravenous Q12H  . LORazepam  1 mg Oral TID  . multivitamin with minerals  1 tablet Oral Daily  . pantoprazole (PROTONIX) IV  40 mg Intravenous Q12H  . potassium chloride  40 mEq Oral Q4H  . sodium chloride  3 mL Intravenous Q12H  . sucralfate  1 g Oral TID WC & HS  . thiamine  100 mg Oral Daily   Or  . thiamine  100 mg Intravenous Daily   Continuous Infusions: . sodium chloride 50 mL/hr at 03/01/13 0600    Active Problems:   Hypokalemia   Pancreatitis   Nausea & vomiting   Alcohol abuse   Upper GI bleed   Hyponatremia   NSVT (nonsustained ventricular tachycardia)    Time spent: 25 minutes    Kaho Selle, MD, FACP, FHM. [facility] Pager [phone]  If 7PM-7AM, please contact night-coverage [email or web address] Password [email or web address] 03/02/2013, 11:45 AM    LOS: 3 days

## 2013-03-03 ENCOUNTER — Encounter (HOSPITAL_COMMUNITY): Payer: Self-pay | Admitting: Gastroenterology

## 2013-03-03 DIAGNOSIS — K859 Acute pancreatitis without necrosis or infection, unspecified: Secondary | ICD-10-CM

## 2013-03-03 DIAGNOSIS — E876 Hypokalemia: Secondary | ICD-10-CM

## 2013-03-03 DIAGNOSIS — I4729 Other ventricular tachycardia: Secondary | ICD-10-CM

## 2013-03-03 DIAGNOSIS — I472 Ventricular tachycardia, unspecified: Secondary | ICD-10-CM

## 2013-03-03 DIAGNOSIS — K920 Hematemesis: Secondary | ICD-10-CM

## 2013-03-03 LAB — CBC
HEMATOCRIT: 33.2 % — AB (ref 36.0–46.0)
Hemoglobin: 11.3 g/dL — ABNORMAL LOW (ref 12.0–15.0)
MCH: 32.9 pg (ref 26.0–34.0)
MCHC: 34 g/dL (ref 30.0–36.0)
MCV: 96.8 fL (ref 78.0–100.0)
Platelets: 174 10*3/uL (ref 150–400)
RBC: 3.43 MIL/uL — ABNORMAL LOW (ref 3.87–5.11)
RDW: 13.1 % (ref 11.5–15.5)
WBC: 4.8 10*3/uL (ref 4.0–10.5)

## 2013-03-03 LAB — BASIC METABOLIC PANEL
CALCIUM: 8 mg/dL — AB (ref 8.4–10.5)
CHLORIDE: 100 meq/L (ref 96–112)
CO2: 29 mEq/L (ref 19–32)
CREATININE: 0.48 mg/dL — AB (ref 0.50–1.10)
GFR calc Af Amer: >90 mL/min (ref >90)
GFR calc non Af Amer: >90 mL/min (ref >90)
Glucose, Bld: 113 mg/dL — ABNORMAL HIGH (ref 70–99)
Potassium: 4 mEq/L (ref 3.7–5.3)
Sodium: 136 mEq/L — ABNORMAL LOW (ref 137–147)

## 2013-03-03 MED ORDER — LORAZEPAM 2 MG/ML IJ SOLN
1.0000 mg | Freq: Four times a day (QID) | INTRAMUSCULAR | Status: AC | PRN
  Administered 2013-03-03 – 2013-03-05 (×6): 1 mg via INTRAVENOUS
  Filled 2013-03-03 (×6): qty 1

## 2013-03-03 MED ORDER — LORAZEPAM 1 MG PO TABS
1.0000 mg | ORAL_TABLET | Freq: Four times a day (QID) | ORAL | Status: AC | PRN
  Administered 2013-03-05 (×2): 1 mg via ORAL
  Filled 2013-03-03 (×3): qty 1

## 2013-03-03 MED ORDER — LORAZEPAM 2 MG/ML IJ SOLN: 0.0000 mg | Freq: Four times a day (QID) | INTRAMUSCULAR | Status: DC

## 2013-03-03 MED ORDER — LORAZEPAM 1 MG PO TABS
2.0000 mg | ORAL_TABLET | Freq: Three times a day (TID) | ORAL | Status: DC
  Administered 2013-03-03 – 2013-03-05 (×7): 2 mg via ORAL
  Filled 2013-03-03 (×7): qty 2

## 2013-03-03 MED ORDER — LORAZEPAM 2 MG/ML IJ SOLN
2.0000 mg | Freq: Once | INTRAMUSCULAR | Status: AC
  Administered 2013-03-03: 2 mg via INTRAVENOUS
  Filled 2013-03-03: qty 1

## 2013-03-03 NOTE — Progress Notes (Addendum)
[facility] PROGRESS NOTE    Fayette Gasner BZJ:[id number] DOB: January 23, 1956 DOA: 02/27/2013 PCP: Antony Blackbird, MD  HPI/Brief narrative 58 year old female with history of alcohol dependence, major depression, GERD, HTN presented with several days history of nausea, vomiting, abdominal pain and hematemesis but no melena. She was admitted for alcoholic pancreatitis and upper GI bleed. She underwent EGD by GI on 1/9 which showed ulcerative esophagitis. Diet is being gradually advanced by GI. Patient continues to have significant alcohol withdrawal.  Assessment/Plan:  Alcoholic pancreatitis - Patient was admitted to the hospital, bowels were rested and she was placed on IV fluids and pain medications. Abdominal pain has improved. Bowels were rested, then she was started on clear liquid diet and gradually advanced to low-fat diet. Lipase improved.   Upper GI bleed secondary to ulcerative esophagitis - Confirmed by EGD on 1/9. Continue IV PPI and sucralfate. - Continue low-fat diet. - keep on Carafate slurry, IV PPI Q 12 hours until close to discharge and then change to PPI PO BID for 3 months and then PPI QD indefinitely - Followup with Dr. Wilford Corner in one month from discharge. GI signed off.  Hypokalemia/hypomagnesemia -Replaced  NSVT x1/PSVT - Likely secondary to electrolyte abnormalities. Continue monitoring on telemetry. Replaced potassium and magnesium. 2-D echo: LVEF 60-65%, mild LVH and second grade 1 diastolic dysfunction and also suggests hypovolemia-increase IV fluids..   Anemia/thrombocytopenia  - Likely chronic. Hemoglobin of 15.4 on admission was probably from hemoconcentration.  - Hemoglobin Stable. Transfuse if hemoglobin less than 7 g per DL. Thrombocytopenia resolved.  History of depression - Patient denies suicidal or homicidal ideations. She says that she has a lot of stressors at home. Psychiatric consultation appreciated-started Celexa 1/11 gradually titrate  up.  Alcohol dependence & withdrawal - Continue Ativan protocol. Added scheduled Ativan 1/11 secondary to persistent symptoms despite Ativan protocol. Despite scheduled Ativan, patient continues to demonstrate significant withdrawal symptoms and CIWA 16-17. Was increased to do Ativan to 2 mg by mouth 3 times a day and continue when necessary Ativan.  Dehydration with hyponatremia - Improved. Continue IV fluids.   Code Status: Full Family Communication: None at bedside Disposition Plan: Home when medically stable   Consultants:  GI-signed off 1/13  Psychiatry  Procedures:  EGD 1/9  Antibiotics:  None   Subjective: Retrosternal pain and sensation of blockage on oral intake. Feel significantly anxious and tremulous. Denies delusions or hallucinations.    Objective: Filed Vitals:   03/03/13 0509 03/03/13 0633 03/03/13 1200 03/03/13 1500  BP: 144/91 129/89 130/87 124/84  Pulse: 81 79 80 87  Temp:  98.2 F (36.8 C)  98.6 F (37 C)  TempSrc:  Oral  Oral  Resp:    16  Height:      Weight:      SpO2:    99%    Intake/Output Summary (Last 24 hours) at 03/03/13 1609 Last data filed at 03/03/13 1400  Gross per 24 hour  Intake   1040 ml  Output   1100 ml  Net    -60 ml   Filed Weights   02/27/13 1723  Weight: 59.3 kg (130 lb 11.7 oz)     Exam:  General exam: Young female lying in bed, anxious and tremulous. Respiratory system: Clear. No increased work of breathing. Cardiovascular system: S1 & S2 heard, RRR. No JVD, murmurs, gallops, clicks or pedal edema. Telemetry: Sinus rhythm. No further episodes of NSVT. Single episode of brief NSSVT on 1/12. Gastrointestinal system: Abdomen is nondistended, soft.  Mild epigastric tenderness without peritoneal signs. Normal bowel sounds heard. Central nervous system: Alert and oriented. No focal neurological deficits. Extremities: Symmetric 5 x 5 power.No tremors.    Data Reviewed: Basic Metabolic Panel:  Recent  Labs Lab 02/27/13 0958 02/28/13 0555 03/01/13 0611 03/01/13 1805 03/02/13 0525 03/03/13 0520  NA 127* 135* 135* 135* 135* 136*  K 2.7* 3.7 2.4* 3.1* 3.1* 4.0  CL 74* 97 94* 93* 97 100  CO2 21 25 32 33* 29 29  GLUCOSE 218* 104* 129* 110* 130* 113*  BUN 33* 14 5* 4* 3* <3*  CREATININE 0.98 0.60 0.53 0.47* 0.43* 0.48*  CALCIUM 10.9* 7.9* 8.0* 8.1* 7.7* 8.0*  MG 2.1  --  1.4* 2.5  --   --    Liver Function Tests:  Recent Labs Lab 02/27/13 0958 03/01/13 0611  AST 58* 44*  ALT 40* 28  ALKPHOS 113 76  BILITOT 2.0* 0.9  PROT 8.8* 5.5*  ALBUMIN 4.8 2.8*    Recent Labs Lab 02/27/13 0958 02/28/13 0555  LIPASE 204* 63*   No results found for this basename: AMMONIA,  in the last 168 hours CBC:  Recent Labs Lab 02/27/13 0958 02/27/13 1808 02/28/13 0555 03/01/13 0611 03/03/13 0520  WBC 16.3*  --  8.2 6.0 4.8  NEUTROABS 14.4*  --   --   --   --   HGB 15.4* 11.9* 10.8* 11.5* 11.3*  HCT 42.8 32.5* 32.2* 32.7* 33.2*  MCV 92.0  --  95.3 93.4 96.8  PLT 225  --  127* 142* 174   Cardiac Enzymes:  Recent Labs Lab 02/27/13 0958  TROPONINI <0.30   BNP (last 3 results) No results found for this basename: PROBNP,  in the last 8760 hours CBG: No results found for this basename: GLUCAP,  in the last 168 hours  No results found for this or any previous visit (from the past 240 hour(s)).    Studies: No results found.      Scheduled Meds: . citalopram  10 mg Oral Daily  . feeding supplement (RESOURCE BREEZE)  1 Container Oral TID BM  . folic acid  1 mg Oral Daily  . LORazepam  2 mg Oral TID  . multivitamin with minerals  1 tablet Oral Daily  . pantoprazole (PROTONIX) IV  40 mg Intravenous Q12H  . sodium chloride  3 mL Intravenous Q12H  . sucralfate  1 g Oral TID WC & HS  . thiamine  100 mg Oral Daily   Or  . thiamine  100 mg Intravenous Daily   Continuous Infusions: . 0.9 % NaCl with KCl 20 mEq / L 100 mL/hr at 03/03/13 6073    Active Problems:    Hypokalemia   Pancreatitis   Nausea & vomiting   Alcohol abuse   Upper GI bleed   Hyponatremia   NSVT (nonsustained ventricular tachycardia)    Time spent: 25 minutes    Freyja Govea, MD, FACP, FHM. [facility] Pager [phone]  If 7PM-7AM, please contact night-coverage [email or web address] Password [email or web address] 03/03/2013, 4:09 PM    LOS: 4 days

## 2013-03-03 NOTE — Progress Notes (Signed)
Patient ID: Julie Carson, female   DOB: Jan 29, 1956, 58 y.o.   MRN: [id number] [name] 58 y.o. April 10, 1955   Subjective: Patient very tearful and anxious. Reports chest pain with swallowing and complains of abdominal pain.  Objective: Vital signs in last 24 hours: Filed Vitals:   03/03/13 0633  BP: 129/89  Pulse: 79  Temp: 98.2 F (36.8 C)  Resp:     Physical Exam: Gen: awake, tearful, tremulous Abd: minimal epigastric tenderness with guarding, soft, nondistended, +BS  Lab Results:  Recent Labs  03/01/13 0611 03/01/13 1805 03/02/13 0525 03/03/13 0520  NA 135* 135* 135* 136*  K 2.4* 3.1* 3.1* 4.0  CL 94* 93* 97 100  CO2 32 33* 29 29  GLUCOSE 129* 110* 130* 113*  BUN 5* 4* 3* <3*  CREATININE 0.53 0.47* 0.43* 0.48*  CALCIUM 8.0* 8.1* 7.7* 8.0*  MG 1.4* 2.5  --   --     Recent Labs  03/01/13 0611  AST 44*  ALT 28  ALKPHOS 76  BILITOT 0.9  PROT 5.5*  ALBUMIN 2.8*    Recent Labs  03/01/13 0611 03/03/13 0520  WBC 6.0 4.8  HGB 11.5* 11.3*  HCT 32.7* 33.2*  MCV 93.4 96.8  PLT 142* 174   No results found for this basename: LABPROT, INR,  in the last 72 hours    Assessment/Plan 58 yo with acute pancreatitis and erosive esophagitis. I think her anxiety is the main source of her chest pain and she may be having panic attacks. Would keep on Carafate slurry, IV PPI Q 12 hours until close to discharge and then change to PPI PO BID for 3 months and then PPI QD indefinitely. I do not think a repeat EGD is needed at this time. Anxiety treatment per Dr. Algis Liming. F/U with me in 1 month. Will sign off. Call us back if questions.   Home C. 03/03/2013, 12:21 PM

## 2013-03-03 NOTE — Progress Notes (Signed)
Clinical Social Work  CSW met with patient at bedside. Patient laying in bed and watching TV and reports she does not feel well. Patient reports she does not want to talk and asked CSW to follow up tomorrow. CSW left CSW contact information and encouraged patient to call if any needs arise.  [place], [place] [phone]

## 2013-03-04 DIAGNOSIS — F4321 Adjustment disorder with depressed mood: Secondary | ICD-10-CM

## 2013-03-04 DIAGNOSIS — K2210 Ulcer of esophagus without bleeding: Secondary | ICD-10-CM | POA: Diagnosis present

## 2013-03-04 DIAGNOSIS — F3289 Other specified depressive episodes: Secondary | ICD-10-CM

## 2013-03-04 DIAGNOSIS — E860 Dehydration: Secondary | ICD-10-CM

## 2013-03-04 DIAGNOSIS — E871 Hypo-osmolality and hyponatremia: Secondary | ICD-10-CM

## 2013-03-04 DIAGNOSIS — F329 Major depressive disorder, single episode, unspecified: Secondary | ICD-10-CM

## 2013-03-04 DIAGNOSIS — K922 Gastrointestinal hemorrhage, unspecified: Secondary | ICD-10-CM

## 2013-03-04 LAB — BASIC METABOLIC PANEL
BUN: 4 mg/dL — ABNORMAL LOW (ref 6–23)
CHLORIDE: 100 meq/L (ref 96–112)
CO2: 28 mEq/L (ref 19–32)
Calcium: 8.4 mg/dL (ref 8.4–10.5)
Creatinine, Ser: 0.53 mg/dL (ref 0.50–1.10)
GFR calc non Af Amer: >90 mL/min (ref >90)
Glucose, Bld: 134 mg/dL — ABNORMAL HIGH (ref 70–99)
POTASSIUM: 4 meq/L (ref 3.7–5.3)
SODIUM: 137 meq/L (ref 137–147)

## 2013-03-04 LAB — MAGNESIUM: MAGNESIUM: 1.6 mg/dL (ref 1.5–2.5)

## 2013-03-04 MED ORDER — CITALOPRAM HYDROBROMIDE 20 MG PO TABS
20.0000 mg | ORAL_TABLET | Freq: Every day | ORAL | Status: DC
  Administered 2013-03-05 – 2013-03-09 (×5): 20 mg via ORAL
  Filled 2013-03-04 (×5): qty 1

## 2013-03-04 MED ORDER — CITALOPRAM HYDROBROMIDE 10 MG PO TABS
10.0000 mg | ORAL_TABLET | Freq: Once | ORAL | Status: AC
  Administered 2013-03-04: 10 mg via ORAL
  Filled 2013-03-04: qty 1

## 2013-03-04 NOTE — Progress Notes (Signed)
Clinical Social Work  CSW went to room to meet with patient. Patient laying in bed and reports she does not feel well and is not interested in talking with CSW at this time. Patient has CSW contact information and will call CSW if further needs arise. CSW will continue to follow to offer support during hospitalization.  [place], [place] [phone]

## 2013-03-04 NOTE — Consult Note (Signed)
Psychiatry followup note   Assessment: AXIS I:  Depressive Disorder NOS and Alcohol abuse AXIS II:  Deferred AXIS III:   Past Medical History  Diagnosis Date  . Anxiety   . GERD (gastroesophageal reflux disease)   . PTSD (post-traumatic stress disorder)   . ADHD (attention deficit hyperactivity disorder)   . Migraine   . UTI (urinary tract infection)   . Hypertension   . Allergy   . Lesion of subcutaneous tissue 2013    Deltoid  . Pneumonia   . Animal dander allergy   . Arthritis   . Osteopenia    Subjective:   Patient seen chart reviewed.  The patient remains anxious and sometimes tearful but overall she is feeling better.  Her shakes are less intense and less frequent.  She is on Ativan.  She is still on Celexa 10 mg and tolerating her medication without any side effects.  She denies any suicidal thoughts or homicidal thoughts.  She denies any psychosis or any paranoia.  She has some crying spells and depressed mood but she is feeling Celexa is working and helping her depression and anxiety symptoms.  Past Psychiatric History: Past Medical History  Diagnosis Date  . Anxiety   . GERD (gastroesophageal reflux disease)   . PTSD (post-traumatic stress disorder)   . ADHD (attention deficit hyperactivity disorder)   . Migraine   . UTI (urinary tract infection)   . Hypertension   . Allergy   . Lesion of subcutaneous tissue 2013    Deltoid  . Pneumonia   . Animal dander allergy   . Arthritis   . Osteopenia     reports that she has been smoking Cigarettes.  She has been smoking about 0.00 packs per day. She has never used smokeless tobacco. She reports that she drinks alcohol. She reports that she does not use illicit drugs. History reviewed. No pertinent family history.   Living Arrangements: Alone   Abuse/Neglect [facility]) Physical Abuse: Denies Verbal Abuse: Denies Sexual Abuse: Denies Allergies:  No Known Allergies  Objective: Blood pressure 133/77, pulse 86,  temperature 98.6 F (37 C), temperature source Oral, resp. rate 16, height 5' 2"  (1.575 m), weight 130 lb 11.7 oz (59.3 kg), SpO2 99.00%.Body mass index is 23.91 kg/(m^2). Results for orders placed during the hospital encounter of 02/27/13 (from the past 72 hour(s))  BASIC METABOLIC PANEL     Status: Abnormal   Collection Time    03/01/13  6:05 PM      Result Value Range   Sodium 135 (*) 137 - 147 mEq/L   Potassium 3.1 (*) 3.7 - 5.3 mEq/L   Comment: DELTA CHECK NOTED     NO VISIBLE HEMOLYSIS   Chloride 93 (*) 96 - 112 mEq/L   CO2 33 (*) 19 - 32 mEq/L   Glucose, Bld 110 (*) 70 - 99 mg/dL   BUN 4 (*) 6 - 23 mg/dL   Creatinine, Ser 0.47 (*) 0.50 - 1.10 mg/dL   Calcium 8.1 (*) 8.4 - 10.5 mg/dL   GFR calc non Af Amer >90  >90 mL/min   GFR calc Af Amer >90  >90 mL/min   Comment: (NOTE)     The eGFR has been calculated using the CKD EPI equation.     This calculation has not been validated in all clinical situations.     eGFR's persistently <90 mL/min signify possible Chronic Kidney     Disease.  MAGNESIUM     Status: None   Collection  Time    03/01/13  6:05 PM      Result Value Range   Magnesium 2.5  1.5 - 2.5 mg/dL  BASIC METABOLIC PANEL     Status: Abnormal   Collection Time    03/02/13  5:25 AM      Result Value Range   Sodium 135 (*) 137 - 147 mEq/L   Potassium 3.1 (*) 3.7 - 5.3 mEq/L   Chloride 97  96 - 112 mEq/L   CO2 29  19 - 32 mEq/L   Glucose, Bld 130 (*) 70 - 99 mg/dL   BUN 3 (*) 6 - 23 mg/dL   Creatinine, Ser 0.43 (*) 0.50 - 1.10 mg/dL   Calcium 7.7 (*) 8.4 - 10.5 mg/dL   GFR calc non Af Amer >90  >90 mL/min   GFR calc Af Amer >90  >90 mL/min   Comment: (NOTE)     The eGFR has been calculated using the CKD EPI equation.     This calculation has not been validated in all clinical situations.     eGFR's persistently <90 mL/min signify possible Chronic Kidney     Disease.  CBC     Status: Abnormal   Collection Time    03/03/13  5:20 AM      Result Value Range    WBC 4.8  4.0 - 10.5 K/uL   RBC 3.43 (*) 3.87 - 5.11 MIL/uL   Hemoglobin 11.3 (*) 12.0 - 15.0 g/dL   HCT 33.2 (*) 36.0 - 46.0 %   MCV 96.8  78.0 - 100.0 fL   MCH 32.9  26.0 - 34.0 pg   MCHC 34.0  30.0 - 36.0 g/dL   RDW 13.1  11.5 - 15.5 %   Platelets 174  150 - 400 K/uL  BASIC METABOLIC PANEL     Status: Abnormal   Collection Time    03/03/13  5:20 AM      Result Value Range   Sodium 136 (*) 137 - 147 mEq/L   Potassium 4.0  3.7 - 5.3 mEq/L   Comment: DELTA CHECK NOTED     REPEATED TO VERIFY     NO VISIBLE HEMOLYSIS   Chloride 100  96 - 112 mEq/L   CO2 29  19 - 32 mEq/L   Glucose, Bld 113 (*) 70 - 99 mg/dL   BUN <3 (*) 6 - 23 mg/dL   Creatinine, Ser 0.48 (*) 0.50 - 1.10 mg/dL   Calcium 8.0 (*) 8.4 - 10.5 mg/dL   GFR calc non Af Amer >90  >90 mL/min   GFR calc Af Amer >90  >90 mL/min   Comment: (NOTE)     The eGFR has been calculated using the CKD EPI equation.     This calculation has not been validated in all clinical situations.     eGFR's persistently <90 mL/min signify possible Chronic Kidney     Disease.  BASIC METABOLIC PANEL     Status: Abnormal   Collection Time    03/04/13  5:25 AM      Result Value Range   Sodium 137  137 - 147 mEq/L   Potassium 4.0  3.7 - 5.3 mEq/L   Chloride 100  96 - 112 mEq/L   CO2 28  19 - 32 mEq/L   Glucose, Bld 134 (*) 70 - 99 mg/dL   BUN 4 (*) 6 - 23 mg/dL   Creatinine, Ser 0.53  0.50 - 1.10 mg/dL   Calcium 8.4  8.4 -  10.5 mg/dL   GFR calc non Af Amer >90  >90 mL/min   GFR calc Af Amer >90  >90 mL/min   Comment: (NOTE)     The eGFR has been calculated using the CKD EPI equation.     This calculation has not been validated in all clinical situations.     eGFR's persistently <90 mL/min signify possible Chronic Kidney     Disease.  MAGNESIUM     Status: None   Collection Time    03/04/13  5:25 AM      Result Value Range   Magnesium 1.6  1.5 - 2.5 mg/dL   Labs are reviewed and are pertinent for low potassium.  Current  Facility-Administered Medications  Medication Dose Route Frequency Provider Last Rate Last Dose  . 0.9 % NaCl with KCl 20 mEq/ L  infusion   Intravenous Continuous Modena Jansky, MD 100 mL/hr at 03/04/13 1309    . acetaminophen (TYLENOL) tablet 650 mg  650 mg Oral Q6H PRN Kelvin Cellar, MD       Or  . acetaminophen (TYLENOL) suppository 650 mg  650 mg Rectal Q6H PRN Kelvin Cellar, MD      . citalopram (CELEXA) tablet 10 mg  10 mg Oral Daily Modena Jansky, MD   10 mg at 03/04/13 1020  . feeding supplement (RESOURCE BREEZE) (RESOURCE BREEZE) liquid 1 Container  1 Container Oral TID BM Dagmar Hait, RD   1 Container at 03/04/13 1020  . folic acid (FOLVITE) tablet 1 mg  1 mg Oral Daily Kelvin Cellar, MD   1 mg at 03/04/13 1020  . LORazepam (ATIVAN) tablet 1 mg  1 mg Oral Q6H PRN Jeryl Columbia, NP       Or  . LORazepam (ATIVAN) injection 1 mg  1 mg Intravenous Q6H PRN Jeryl Columbia, NP   1 mg at 03/04/13 1302  . LORazepam (ATIVAN) tablet 2 mg  2 mg Oral TID Modena Jansky, MD   2 mg at 03/04/13 1635  . morphine 2 MG/ML injection 2 mg  2 mg Intravenous Q4H PRN Kelvin Cellar, MD   2 mg at 03/04/13 1303  . multivitamin with minerals tablet 1 tablet  1 tablet Oral Daily Kelvin Cellar, MD   1 tablet at 03/04/13 1020  . ondansetron (ZOFRAN) tablet 4 mg  4 mg Oral Q6H PRN Kelvin Cellar, MD       Or  . ondansetron (ZOFRAN) injection 4 mg  4 mg Intravenous Q6H PRN Kelvin Cellar, MD   4 mg at 03/01/13 2018  . oxyCODONE (Oxy IR/ROXICODONE) immediate release tablet 5 mg  5 mg Oral Q4H PRN Kelvin Cellar, MD   5 mg at 03/03/13 1114  . pantoprazole (PROTONIX) injection 40 mg  40 mg Intravenous Q12H Winfield Cunas., MD   40 mg at 03/04/13 1020  . sodium chloride 0.9 % injection 3 mL  3 mL Intravenous Q12H Kelvin Cellar, MD   3 mL at 03/03/13 2147  . sucralfate (CARAFATE) 1 GM/10ML suspension 1 g  1 g Oral TID WC & HS Winfield Cunas., MD   1 g at 03/04/13 1635  .  thiamine (VITAMIN B-1) tablet 100 mg  100 mg Oral Daily Kelvin Cellar, MD   100 mg at 03/04/13 1020   Or  . thiamine (B-1) injection 100 mg  100 mg Intravenous Daily Kelvin Cellar, MD        Psychiatric Specialty Exam:  Blood pressure 133/77, pulse 86, temperature 98.6 F (37 C), temperature source Oral, resp. rate 16, height 5' 2"  (1.575 m), weight 130 lb 11.7 oz (59.3 kg), SpO2 99.00%.Body mass index is 23.91 kg/(m^2).  General Appearance: Casual  Eye Contact::  Fair  Speech:  Slow  Volume:  Normal  Mood:  Dysphoric  Affect:  Congruent  Thought Process:  Linear and Logical  Orientation:  Full (Time, Place, and Person)  Thought Content:  Rumination  Suicidal Thoughts:  No  Homicidal Thoughts:  No  Memory:  Immediate;   Good Recent;   Good Remote;   Good  Judgement:  Fair  Insight:  Fair  Psychomotor Activity:  Decreased  Concentration:  Fair  Recall:  Fair  Akathisia:  No  Handed:  Right  AIMS (if indicated):     Assets:  Communication Skills Desire for Improvement Housing  Sleep:      Treatment Plan Summary: Medication management Recommend to increase Celexa 20 mg since patient is tolerating without any side effects.  Continue Ativan to avoid any Withdrawal symptoms.  The patient does require CD IOP program for her alcohol abuse and counseling her coping skills.  Patient does not want to admit to a psych hospital.  Please call [phone] if you have any further questions.    Julie Carson,Julie T. 03/04/2013 4:41 PM

## 2013-03-04 NOTE — Progress Notes (Signed)
[facility] PROGRESS NOTE    Dayelin Balducci WRU:[id number] DOB: 1955/07/28 DOA: 02/27/2013 PCP: Antony Blackbird, MD  HPI/Brief narrative 58 yo WF PMHx  alcohol dependence, major depression, GERD, HTN presented with several days history of nausea, vomiting, abdominal pain and hematemesis but no melena. She was admitted for alcoholic pancreatitis and upper GI bleed. She underwent EGD by GI on 1/9 which showed ulcerative esophagitis. 03/04/2013 currently states negative CP, negative SOB. States spoke with psychiatrist today but does not know what the plan of care entailed. States in the distant past saw outpatient psychiatrist but does not recall name.   Assessment/Plan:  Alcoholic pancreatitis - Patient was admitted to the hospital, bowels were rested and she was placed on IV fluids and pain medications.  -Abdominal pain has improved. Started clear liquids -tolerating.  -Lipase improved. -Patient tolerates advance diet we'll discharge in a.m.Marland Kitchen  Upper GI bleed secondary to ulcerative esophagitis - Confirmed by EGD on 1/9. Continue IV PPI and sucralfate. - Diet advance to fat modified; if patient tolerates overnight will discharge in the a.m.  Hypokalemia/hypomagnesemia -Resolved  NSVT x1 - Likely secondary to electrolyte abnormalities. Continue monitoring on telemetry.   Diastolic CHF  -See below for results of echocardiogram .   Anemia/thrombocytopenia  - Likely chronic. Hemoglobin of 15.4 on admission was probably from hemoconcentration.  - Stable. Transfuse if hemoglobin less than 7 g per DL.   History of depression - Patient denies suicidal or homicidal ideations. She says that she has a lot of stressors at home. -Increase Celexa per psychiatry to 20 mg daily   Alcohol dependence & withdrawal - Continue Ativan protocol. Added scheduled Ativan 1/11 secondary to persistent symptoms despite Ativan protocol.  Dehydration with hyponatremia - Improved. -If vitals, labs within  normal limits in the a.m. will discharge   Code Status: Full Family Communication: None at bedside Disposition Plan: Home when medically stable   Consultants: Dr Laurence Spates (GI) Dr.ARFEEN,SYED T.( Psychiatry)  Procedures: Echocardiogram 03/01/2013 -Left ventricle: LVH.  -LVEF=60% to 65%.  -(grade 1 diastolic dysfunction). - Aortic valve: Valve area: 2.1cm^2(VTI). Valve area: 1.9cm^2 (Vmax). - Systemic veins: IVC appears small, consistent with low RA pressures and hypovolemia.     EGD 1/9; ulcerative esophagitis  Antibiotics:  None    Objective: Filed Vitals:   03/03/13 1800 03/03/13 2143 03/04/13 0700 03/04/13 1407  BP: 120/86 125/80 134/93 133/77  Pulse: 80 86 102 86  Temp:  98.3 F (36.8 C) 98.2 F (36.8 C) 98.6 F (37 C)  TempSrc:  Oral Oral Oral  Resp:  18 18 16   Height:      Weight:      SpO2:  99% 99% 99%    Intake/Output Summary (Last 24 hours) at 03/04/13 1644 Last data filed at 03/04/13 1300  Gross per 24 hour  Intake    360 ml  Output   1200 ml  Net   -840 ml   Filed Weights   02/27/13 1723  Weight: 59.3 kg (130 lb 11.7 oz)     Exam:  General exam: Young female sitting up comfortably in bed. Appears less anxious. Respiratory system: Clear. No increased work of breathing. Cardiovascular system: S1 & S2 heard, RRR. No JVD, murmurs, gallops, clicks or pedal edema. Telemetry: Sinus rhythm. No further episodes of NSVT.  Gastrointestinal system: Abdomen is nondistended, soft. Mild epigastric tenderness without peritoneal signs. Normal bowel sounds heard. Central nervous system: Alert and oriented. No focal neurological deficits. Extremities: Symmetric 5 x 5 power.No tremors.  Data Reviewed: Basic Metabolic Panel:  Recent Labs Lab 02/27/13 0958  03/01/13 0611 03/01/13 1805 03/02/13 0525 03/03/13 0520 03/04/13 0525  NA 127*  < > 135* 135* 135* 136* 137  K 2.7*  < > 2.4* 3.1* 3.1* 4.0 4.0  CL 74*  < > 94* 93* 97 100 100  CO2 21   < > 32 33* 29 29 28   GLUCOSE 218*  < > 129* 110* 130* 113* 134*  BUN 33*  < > 5* 4* 3* <3* 4*  CREATININE 0.98  < > 0.53 0.47* 0.43* 0.48* 0.53  CALCIUM 10.9*  < > 8.0* 8.1* 7.7* 8.0* 8.4  MG 2.1  --  1.4* 2.5  --   --  1.6  < > = values in this interval not displayed. Liver Function Tests:  Recent Labs Lab 02/27/13 0958 03/01/13 0611  AST 58* 44*  ALT 40* 28  ALKPHOS 113 76  BILITOT 2.0* 0.9  PROT 8.8* 5.5*  ALBUMIN 4.8 2.8*    Recent Labs Lab 02/27/13 0958 02/28/13 0555  LIPASE 204* 63*   No results found for this basename: AMMONIA,  in the last 168 hours CBC:  Recent Labs Lab 02/27/13 0958 02/27/13 1808 02/28/13 0555 03/01/13 0611 03/03/13 0520  WBC 16.3*  --  8.2 6.0 4.8  NEUTROABS 14.4*  --   --   --   --   HGB 15.4* 11.9* 10.8* 11.5* 11.3*  HCT 42.8 32.5* 32.2* 32.7* 33.2*  MCV 92.0  --  95.3 93.4 96.8  PLT 225  --  127* 142* 174   Cardiac Enzymes:  Recent Labs Lab 02/27/13 0958  TROPONINI <0.30   BNP (last 3 results) No results found for this basename: PROBNP,  in the last 8760 hours CBG: No results found for this basename: GLUCAP,  in the last 168 hours  No results found for this or any previous visit (from the past 240 hour(s)).    Studies: No results found.      Scheduled Meds: . citalopram  10 mg Oral Daily  . feeding supplement (RESOURCE BREEZE)  1 Container Oral TID BM  . folic acid  1 mg Oral Daily  . LORazepam  2 mg Oral TID  . multivitamin with minerals  1 tablet Oral Daily  . pantoprazole (PROTONIX) IV  40 mg Intravenous Q12H  . sodium chloride  3 mL Intravenous Q12H  . sucralfate  1 g Oral TID WC & HS  . thiamine  100 mg Oral Daily   Or  . thiamine  100 mg Intravenous Daily   Continuous Infusions: . 0.9 % NaCl with KCl 20 mEq / L 100 mL/hr at 03/04/13 1309    Active Problems:   Hypokalemia   Pancreatitis   Nausea & vomiting   Alcohol abuse   Upper GI bleed   Hyponatremia   NSVT (nonsustained ventricular  tachycardia)    Time spent: 40 minutes    Kimberlly Norgard, Geraldo Docker, MD,  [facility] Pager [phone]  If 7PM-7AM, please contact night-coverage [email or web address] Password [email or web address] 03/04/2013, 4:44 PM    LOS: 5 days

## 2013-03-05 DIAGNOSIS — K208 Other esophagitis without bleeding: Secondary | ICD-10-CM

## 2013-03-05 LAB — CBC WITH DIFFERENTIAL/PLATELET
Basophils Absolute: 0.1 10*3/uL (ref 0.0–0.1)
Basophils Relative: 1 % (ref 0–1)
EOS ABS: 0.4 10*3/uL (ref 0.0–0.7)
Eosinophils Relative: 6 % — ABNORMAL HIGH (ref 0–5)
HEMATOCRIT: 33.5 % — AB (ref 36.0–46.0)
Hemoglobin: 10.9 g/dL — ABNORMAL LOW (ref 12.0–15.0)
LYMPHS PCT: 25 % (ref 12–46)
Lymphs Abs: 1.7 10*3/uL (ref 0.7–4.0)
MCH: 32.2 pg (ref 26.0–34.0)
MCHC: 32.5 g/dL (ref 30.0–36.0)
MCV: 98.8 fL (ref 78.0–100.0)
MONO ABS: 1 10*3/uL (ref 0.1–1.0)
Monocytes Relative: 14 % — ABNORMAL HIGH (ref 3–12)
Neutro Abs: 3.8 10*3/uL (ref 1.7–7.7)
Neutrophils Relative %: 54 % (ref 43–77)
Platelets: 244 10*3/uL (ref 150–400)
RBC: 3.39 MIL/uL — AB (ref 3.87–5.11)
RDW: 13.4 % (ref 11.5–15.5)
WBC: 6.9 10*3/uL (ref 4.0–10.5)

## 2013-03-05 LAB — COMPREHENSIVE METABOLIC PANEL
ALBUMIN: 2.7 g/dL — AB (ref 3.5–5.2)
ALK PHOS: 86 U/L (ref 39–117)
ALT: 24 U/L (ref 0–35)
AST: 28 U/L (ref 0–37)
BUN: 6 mg/dL (ref 6–23)
CHLORIDE: 100 meq/L (ref 96–112)
CO2: 27 mEq/L (ref 19–32)
Calcium: 8.4 mg/dL (ref 8.4–10.5)
Creatinine, Ser: 0.56 mg/dL (ref 0.50–1.10)
GFR calc Af Amer: >90 mL/min (ref >90)
GFR calc non Af Amer: >90 mL/min (ref >90)
Glucose, Bld: 129 mg/dL — ABNORMAL HIGH (ref 70–99)
POTASSIUM: 4.3 meq/L (ref 3.7–5.3)
Sodium: 137 mEq/L (ref 137–147)
TOTAL PROTEIN: 5.7 g/dL — AB (ref 6.0–8.3)
Total Bilirubin: 0.4 mg/dL (ref 0.3–1.2)

## 2013-03-05 LAB — LIPASE, BLOOD: Lipase: 93 U/L — ABNORMAL HIGH (ref 11–59)

## 2013-03-05 MED ORDER — CHLORDIAZEPOXIDE HCL 5 MG PO CAPS: 5.0000 mg | ORAL_CAPSULE | Freq: Two times a day (BID) | ORAL | Status: DC

## 2013-03-05 MED ORDER — ZOLPIDEM TARTRATE 5 MG PO TABS
5.0000 mg | ORAL_TABLET | Freq: Once | ORAL | Status: AC
  Administered 2013-03-05: 5 mg via ORAL
  Filled 2013-03-05: qty 1

## 2013-03-05 NOTE — Progress Notes (Signed)
NUTRITION FOLLOW UP  Intervention:   - Diet advancement per MD - Educated pt on low fat diet for pancreatitis - Will continue to monitor   Nutrition Dx:   Inadequate oral intake related to pancreatitis as evidenced by clear liquid diet and suspected wt loss - ongoing now related to inability to eat as evidenced by NPO.    Goal:   Pt to meet >/= 90% of their estimated nutrition needs - not met, pt NPO  New goal: Advance diet as tolerated to low fat diet    Monitor:   Weights, labs, diet advancement  Assessment:   58 y.o. female with a past medical history of alcohol abuse with history of alcohol withdrawal, major depression Who presents to the emergency department with complaints of severe nausea/vomiting with associated abdominal pain.   1/11 - Pt found to have pancreatitis likely related to alcohol use. Pt reports that she feels hungry and that she is concerned that she has not been getting enough nutrition. She suspects that she has lost some weight, but was unable to tell RD her usual body weight. Pt would like to have nutritional supplements. She says that her appetite has been poor for about a month.   1/15 - Reviewed events. GI was following but signed off 1/13, noted pt with acute pancreatitis and erosive esophagitis. Diet advanced to fat modified 1/13. Met with pt who reports she was eating about 25% of meals yesterday and has been drinking at least 2 Resource Breeze/day. Pt said she had a little bit of pain and nausea after eating yesterday. Pt currently NPO.   Lipase elevated  Height: Ht Readings from Last 1 Encounters:  02/27/13 5' 2"  (1.575 m)    Weight Status:   Wt Readings from Last 1 Encounters:  02/27/13 130 lb 11.7 oz (59.3 kg)    Re-estimated needs:  Kcal: 1500-1800  Protein: 70-85 g  Fluid: >/= 1.8 L/day   Skin: WNL   Diet Order: NPO   Intake/Output Summary (Last 24 hours) at 03/05/13 1544 Last data filed at 03/05/13 1500  Gross per 24 hour   Intake    950 ml  Output    900 ml  Net     50 ml    Last BM: 1/13   Labs:   Recent Labs Lab 03/01/13 0611 03/01/13 1805  03/03/13 0520 03/04/13 0525 03/05/13 0515  NA 135* 135*  < > 136* 137 137  K 2.4* 3.1*  < > 4.0 4.0 4.3  CL 94* 93*  < > 100 100 100  CO2 32 33*  < > 29 28 27   BUN 5* 4*  < > <3* 4* 6  CREATININE 0.53 0.47*  < > 0.48* 0.53 0.56  CALCIUM 8.0* 8.1*  < > 8.0* 8.4 8.4  MG 1.4* 2.5  --   --  1.6  --   GLUCOSE 129* 110*  < > 113* 134* 129*  < > = values in this interval not displayed.  CBG (last 3)  No results found for this basename: GLUCAP,  in the last 72 hours  Scheduled Meds: . citalopram  20 mg Oral Daily  . feeding supplement (RESOURCE BREEZE)  1 Container Oral TID BM  . folic acid  1 mg Oral Daily  . LORazepam  2 mg Oral TID  . multivitamin with minerals  1 tablet Oral Daily  . pantoprazole (PROTONIX) IV  40 mg Intravenous Q12H  . sodium chloride  3 mL Intravenous Q12H  .  sucralfate  1 g Oral TID WC & HS  . thiamine  100 mg Oral Daily   Or  . thiamine  100 mg Intravenous Daily    Continuous Infusions: . 0.9 % NaCl with KCl 20 mEq / L 100 mL/hr at 03/05/13 [place], RD, [place] Pager [phone] After Hours Pager

## 2013-03-05 NOTE — Progress Notes (Signed)
Clinical Social Work Progress Note PSYCHIATRY SERVICE LINE 03/05/2013  Patient:  Julie Carson  Account:  [id number]  Admit Date:  02/27/2013  Clinical Social Worker:  Sindy Messing, LCSW  Date/Time:  03/05/2013 02:00 PM  Review of Patient  Overall Medical Condition:   Patient reports she is still feeling bad and unable to tolerate food.   Participation Level:  Active  Participation Quality  Guarded   Other Participation Quality:   Patient appears guarded and somewhat disengaged throughout assessment.   Affect  Depressed   Cognitive  Appropriate   Reaction to Medications/Concerns:   Patient reports she is unsure if antidepressant medication is working.   Modes of Intervention  Support   Summary of Progress/Plan at Discharge   CSW met with patient at bedside. Patient laying in bed with lights off and blinds closed. Patient agreeable to session.    Patient reports she is not feeling well and unable to tolerate diet. Patient feels that withdrawal symptoms are manageable but still feeling sweaty and shaky. Patient reports that she is not hallucinating but just feels ill. Patient remains on CIWA protocol. Patient reports she has not reviewed all information for CD IOP yet but does not have any questions at this time.    Patient reports she is still feeling depressed and has little motivation to get out of bed. Patient reports that she is anxious about mom coming to the hospital to visit and is worried about problems at home. Patient reports she is relying on family but will feel better after visit with mom is over. CSW attempted to inquire about relationship with mom but then mom and sister-in-law arrived. Patient did not wish to speak in front of mom and asked CSW to follow up at later time.      [place], [place] [phone]

## 2013-03-05 NOTE — Progress Notes (Signed)
[facility] PROGRESS NOTE    Julie Carson ZDG:[id number] DOB: [birth year] DOA: 02/27/2013 PCP: Antony Blackbird, MD  HPI/Brief narrative 58 yo WF PMHx  alcohol dependence, major depression, GERD, HTN presented with several days history of nausea, vomiting, abdominal pain and hematemesis but no melena. She was admitted for alcoholic pancreatitis and upper GI bleed. She underwent EGD by GI on 1/9 which showed ulcerative esophagitis. 03/04/2013 currently states negative CP, negative SOB. States spoke with psychiatrist today but does not know what the plan of care entailed. States in the distant past saw outpatient psychiatrist but does not recall name. 02/23/2013 patient states did not tolerate liquid diet well. Positive nausea negative vomiting negative diarrhea but increased abdominal pain. Will make patient n.p.o.   Assessment/Plan:  Alcoholic pancreatitis - Patient was admitted to the hospital, bowels were rested and she was placed on IV fluids and pain medications.  -Abdominal pain has increased. Start n.p.o..  -Lipase increased overnight. -Patient did not tolerate advanced diet will make patient n.p.o. and increase IV fluids; (lipase increased) .  Upper GI bleed secondary to ulcerative esophagitis - Confirmed by EGD on 1/9. Continue IV PPI and sucralfate. - Diet now n.p.o. except for ice chips   Hypokalemia/hypomagnesemia -Resolved  NSVT x1 - Likely secondary to electrolyte abnormalities. Continue monitoring on telemetry.   Diastolic CHF  -See below for results of echocardiogram .   Anemia/thrombocytopenia  - Likely chronic. Hemoglobin of 15.4 on admission was probably from hemoconcentration.  - Stable. Transfuse if hemoglobin less than 7 g/dL.  -Obtain B12, folate, methylmalonic acid  History of depression - Patient denies suicidal or homicidal ideations. She says that she has a lot of stressors at home. -Increase Celexa per psychiatry to 20 mg daily   Alcohol dependence &  withdrawal - Continue Ativan protocol.  -DC'd scheduled Ativan, secondary to sedating effect; patient needs to be out of the bed with lights and shapes open during waking hours and for meals  Dehydration with hyponatremia - Improved. -If vitals, labs within normal limits in the a.m. will discharge   Code Status: Full Family Communication: None at bedside Disposition Plan: Home when medically stable   Consultants: Dr Laurence Spates (GI) Dr.ARFEEN,SYED T.( Psychiatry)  Procedures: Echocardiogram 03/01/2013 -Left ventricle: LVH.  -LVEF=60% to 65%.  -(grade 1 diastolic dysfunction). - Aortic valve: Valve area: 2.1cm^2(VTI). Valve area: 1.9cm^2 (Vmax). - Systemic veins: IVC appears small, consistent with low RA pressures and hypovolemia.  EGD 1/9; ulcerative esophagitis   Antibiotics:    Objective: Filed Vitals:   03/04/13 0700 03/04/13 1407 03/05/13 0636 03/05/13 1424  BP: 134/93 133/77 107/71 125/77  Pulse: 102 86 80 82  Temp: 98.2 F (36.8 C) 98.6 F (37 C) 99.3 F (37.4 C) 98 F (36.7 C)  TempSrc: Oral Oral Oral Oral  Resp: 18 16 16 16   Height:      Weight:      SpO2: 99% 99% 97% 99%    Intake/Output Summary (Last 24 hours) at 03/05/13 1756 Last data filed at 03/05/13 1500  Gross per 24 hour  Intake    950 ml  Output    900 ml  Net     50 ml   Filed Weights   02/27/13 1723  Weight: 59.3 kg (130 lb 11.7 oz)     Exam:  General exam: A./O. X4, NAD, positive anxiety Respiratory system: Clear to ossification bilateral. No increased work of breathing. Cardiovascular system: S1 & S2 heard, RRR. No JVD, murmurs, gallops, clicks or pedal  edema.   Gastrointestinal system: Abdomen is nondistended, soft. Mild epigastric tenderness without peritoneal signs. Normal bowel sounds heard. Extremities: Symmetric 5 x 5 power.No tremors.    Data Reviewed: Basic Metabolic Panel:  Recent Labs Lab 02/27/13 0958  03/01/13 0737 03/01/13 1805 03/02/13 0525  03/03/13 0520 03/04/13 0525 03/05/13 0515  NA 127*  < > 135* 135* 135* 136* 137 137  K 2.7*  < > 2.4* 3.1* 3.1* 4.0 4.0 4.3  CL 74*  < > 94* 93* 97 100 100 100  CO2 21  < > 32 33* 29 29 28 27   GLUCOSE 218*  < > 129* 110* 130* 113* 134* 129*  BUN 33*  < > 5* 4* 3* <3* 4* 6  CREATININE 0.98  < > 0.53 0.47* 0.43* 0.48* 0.53 0.56  CALCIUM 10.9*  < > 8.0* 8.1* 7.7* 8.0* 8.4 8.4  MG 2.1  --  1.4* 2.5  --   --  1.6  --   < > = values in this interval not displayed. Liver Function Tests:  Recent Labs Lab 02/27/13 0958 03/01/13 0611 03/05/13 0515  AST 58* 44* 28  ALT 40* 28 24  ALKPHOS 113 76 86  BILITOT 2.0* 0.9 0.4  PROT 8.8* 5.5* 5.7*  ALBUMIN 4.8 2.8* 2.7*    Recent Labs Lab 02/27/13 0958 02/28/13 0555 03/05/13 0515  LIPASE 204* 63* 93*   No results found for this basename: AMMONIA,  in the last 168 hours CBC:  Recent Labs Lab 02/27/13 0958 02/27/13 1808 02/28/13 0555 03/01/13 0611 03/03/13 0520 03/05/13 0515  WBC 16.3*  --  8.2 6.0 4.8 6.9  NEUTROABS 14.4*  --   --   --   --  3.8  HGB 15.4* 11.9* 10.8* 11.5* 11.3* 10.9*  HCT 42.8 32.5* 32.2* 32.7* 33.2* 33.5*  MCV 92.0  --  95.3 93.4 96.8 98.8  PLT 225  --  127* 142* 174 244   Cardiac Enzymes:  Recent Labs Lab 02/27/13 0958  TROPONINI <0.30   BNP (last 3 results) No results found for this basename: PROBNP,  in the last 8760 hours CBG: No results found for this basename: GLUCAP,  in the last 168 hours  No results found for this or any previous visit (from the past 240 hour(s)).    Studies: No results found.      Scheduled Meds: . citalopram  20 mg Oral Daily  . feeding supplement (RESOURCE BREEZE)  1 Container Oral TID BM  . folic acid  1 mg Oral Daily  . LORazepam  2 mg Oral TID  . multivitamin with minerals  1 tablet Oral Daily  . pantoprazole (PROTONIX) IV  40 mg Intravenous Q12H  . sodium chloride  3 mL Intravenous Q12H  . sucralfate  1 g Oral TID WC & HS  . thiamine  100 mg Oral  Daily   Or  . thiamine  100 mg Intravenous Daily   Continuous Infusions: . 0.9 % NaCl with KCl 20 mEq / L 100 mL/hr at 03/05/13 1062    Active Problems:   Hypokalemia   Pancreatitis   Nausea & vomiting   Alcohol abuse   Upper GI bleed   Hyponatremia   NSVT (nonsustained ventricular tachycardia)   Ulcerative esophagitis    Time spent: 40 minutes    Julie Carson, Geraldo Docker, MD,  [facility] Pager [phone]  If 7PM-7AM, please contact night-coverage [email or web address] Password [email or web address] 03/05/2013, 5:56 PM    LOS: 6 days

## 2013-03-06 DIAGNOSIS — R112 Nausea with vomiting, unspecified: Secondary | ICD-10-CM

## 2013-03-06 LAB — FOLATE

## 2013-03-06 LAB — VITAMIN B12: VITAMIN B 12: 637 pg/mL (ref 211–911)

## 2013-03-06 LAB — HELICOBACTER PYLORI ABS-IGG+IGA, BLD
H Pylori IgA: 2.7 U/mL (ref <9.0)
H Pylori IgG: <0.4 {ISR}

## 2013-03-06 LAB — LIPASE, BLOOD: LIPASE: 70 U/L — AB (ref 11–59)

## 2013-03-06 MED ORDER — LORAZEPAM 1 MG PO TABS
1.0000 mg | ORAL_TABLET | Freq: Four times a day (QID) | ORAL | Status: DC | PRN
  Administered 2013-03-06 (×2): 1 mg via ORAL
  Filled 2013-03-06 (×2): qty 1

## 2013-03-06 MED ORDER — LORAZEPAM 2 MG/ML IJ SOLN: 1.0000 mg | Freq: Four times a day (QID) | INTRAMUSCULAR | Status: DC | PRN

## 2013-03-06 NOTE — Progress Notes (Signed)
Patient expressed desire for medication for anxiety and sleep at 2100.  Discussed with K Schorr NP due to DC of ativan order.  1 mg Ativan ordered at 2100.  Ambien 5 mg ordered for HS.  No other concerns at this time.  Patient resting comfortably.  Will continue to monitor.  Iantha Fallen RN 03/05/13 2300

## 2013-03-06 NOTE — Progress Notes (Signed)
[facility] PROGRESS NOTE    Julie Carson TGG:[id number] DOB: [birth year] DOA: 02/27/2013 PCP: Antony Blackbird, MD  HPI/Brief narrative 58 yo WF PMHx  alcohol dependence, major depression, GERD, HTN presented with several days history of nausea, vomiting, abdominal pain and hematemesis but no melena. She was admitted for alcoholic pancreatitis and upper GI bleed. She underwent EGD by GI on 1/9 which showed ulcerative esophagitis. 03/04/2013 currently states negative CP, negative SOB. States spoke with psychiatrist today but does not know what the plan of care entailed. States in the distant past saw outpatient psychiatrist but does not recall name. 03/05/2013 patient states did not tolerate liquid diet well. Positive nausea negative vomiting negative diarrhea but increased abdominal pain. Will make patient n.p.o. 03/06/2013 patient states has become hungry, still having mild abdominal pain. Requested no when we did advance diet. Will advance diet in the a.m. to clear liquids.  Assessment/Plan:  Alcoholic pancreatitis - Patient was admitted to the hospital, bowels were rested and she was placed on IV fluids and pain medications.  -Abdominal pain has decreased, will restart clear liquids in the a.m.  -Lipase increased overnight. -Patient did not tolerate advanced diet will make patient n.p.o. and increase IV fluids; (lipase decreased today, but still mildly elevated) .  Upper GI bleed secondary to ulcerative esophagitis - Confirmed by EGD on 1/9. Continue IV PPI and sucralfate. - Diet now n.p.o. except for ice chips  -Restart clear liquids on a.m. 1/17  Hypokalemia/hypomagnesemia -Resolved  NSVT x1 - Likely secondary to electrolyte abnormalities. Continue monitoring on telemetry.   Diastolic CHF  -See below for results of echocardiogram .   Anemia/thrombocytopenia  - Likely chronic. Hemoglobin of 15.4 on admission was probably from hemoconcentration.  - Stable. Transfuse if hemoglobin  less than 7 g/dL.  -Obtain B12, folate, methylmalonic acid  History of depression - Patient denies suicidal or homicidal ideations. She says that she has a lot of stressors at home. -Continue Celexa per psychiatry to 20 mg daily   Alcohol dependence & withdrawal - Continue Ativan protocol.  -DC'd scheduled Ativan, secondary to sedating effect; patient needs to be out of the bed with lights and shapes open during waking hours and for meals -DC Ativan patient has been in hospital for 7 days, likelihood of alcohol withdrawal extremely remote  Dehydration with hyponatremia - Improved. -If vitals, labs within normal limits in the a.m. will discharge   Code Status: Full Family Communication: None at bedside Disposition Plan: Home when medically stable   Consultants: Dr Laurence Spates (GI) Dr.ARFEEN,SYED T.( Psychiatry)  Procedures: Echocardiogram 03/01/2013 -Left ventricle: LVH.  -LVEF=60% to 65%.  -(grade 1 diastolic dysfunction). - Aortic valve: Valve area: 2.1cm^2(VTI). Valve area: 1.9cm^2 (Vmax). - Systemic veins: IVC appears small, consistent with low RA pressures and hypovolemia.  EGD 1/9; ulcerative esophagitis   Antibiotics:    Objective: Filed Vitals:   03/05/13 1424 03/05/13 2200 03/06/13 0600 03/06/13 1310  BP: 125/77 122/80 117/78 112/69  Pulse: 82 73 86 80  Temp: 98 F (36.7 C) 98.4 F (36.9 C) 98.3 F (36.8 C) 98.1 F (36.7 C)  TempSrc: Oral Oral Oral Oral  Resp: 16 18 18 20   Height:      Weight:      SpO2: 99% 97% 97% 98%    Intake/Output Summary (Last 24 hours) at 03/06/13 2015 Last data filed at 03/06/13 1900  Gross per 24 hour  Intake   2920 ml  Output   3000 ml  Net    -80  ml   Filed Weights   02/27/13 1723  Weight: 59.3 kg (130 lb 11.7 oz)     Exam:  General exam: A./O. X4, NAD, positive anxiety Respiratory system: Clear to ossification bilateral. No increased work of breathing. Cardiovascular system: S1 & S2 heard, RRR. No JVD,  murmurs, gallops, clicks or pedal edema.   Gastrointestinal system: Abdomen is nondistended, soft. Mild epigastric tenderness without peritoneal signs. Normal bowel sounds heard. Extremities: Symmetric 5 x 5 power.No tremors.    Data Reviewed: Basic Metabolic Panel:  Recent Labs Lab 03/01/13 0611 03/01/13 1805 03/02/13 0525 03/03/13 0520 03/04/13 0525 03/05/13 0515  NA 135* 135* 135* 136* 137 137  K 2.4* 3.1* 3.1* 4.0 4.0 4.3  CL 94* 93* 97 100 100 100  CO2 32 33* 29 29 28 27   GLUCOSE 129* 110* 130* 113* 134* 129*  BUN 5* 4* 3* <3* 4* 6  CREATININE 0.53 0.47* 0.43* 0.48* 0.53 0.56  CALCIUM 8.0* 8.1* 7.7* 8.0* 8.4 8.4  MG 1.4* 2.5  --   --  1.6  --    Liver Function Tests:  Recent Labs Lab 03/01/13 0611 03/05/13 0515  AST 44* 28  ALT 28 24  ALKPHOS 76 86  BILITOT 0.9 0.4  PROT 5.5* 5.7*  ALBUMIN 2.8* 2.7*    Recent Labs Lab 02/28/13 0555 03/05/13 0515 03/06/13 0535  LIPASE 63* 93* 70*   No results found for this basename: AMMONIA,  in the last 168 hours CBC:  Recent Labs Lab 02/28/13 0555 03/01/13 0611 03/03/13 0520 03/05/13 0515  WBC 8.2 6.0 4.8 6.9  NEUTROABS  --   --   --  3.8  HGB 10.8* 11.5* 11.3* 10.9*  HCT 32.2* 32.7* 33.2* 33.5*  MCV 95.3 93.4 96.8 98.8  PLT 127* 142* 174 244   Cardiac Enzymes: No results found for this basename: CKTOTAL, CKMB, CKMBINDEX, TROPONINI,  in the last 168 hours BNP (last 3 results) No results found for this basename: PROBNP,  in the last 8760 hours CBG: No results found for this basename: GLUCAP,  in the last 168 hours  No results found for this or any previous visit (from the past 240 hour(s)).    Studies: No results found.      Scheduled Meds: . citalopram  20 mg Oral Daily  . feeding supplement (RESOURCE BREEZE)  1 Container Oral TID BM  . folic acid  1 mg Oral Daily  . multivitamin with minerals  1 tablet Oral Daily  . pantoprazole (PROTONIX) IV  40 mg Intravenous Q12H  . sodium chloride  3 mL  Intravenous Q12H  . sucralfate  1 g Oral TID WC & HS  . thiamine  100 mg Oral Daily   Or  . thiamine  100 mg Intravenous Daily   Continuous Infusions: . 0.9 % NaCl with KCl 20 mEq / L 100 mL/hr at 03/06/13 1650    Active Problems:   Hypokalemia   Pancreatitis   Nausea & vomiting   Alcohol abuse   Upper GI bleed   Hyponatremia   NSVT (nonsustained ventricular tachycardia)   Ulcerative esophagitis    Time spent: 40 minutes    Keyry [place], Geraldo Docker, MD,  [facility] Pager [phone]  If 7PM-7AM, please contact night-coverage [email or web address] Password [email or web address] 03/06/2013, 8:15 PM    LOS: 7 days

## 2013-03-06 NOTE — Progress Notes (Signed)
Clinical Social Work  CSW attempted to meet with patient but patient sleeping. CSW will follow up at later time.  [place], [place] [phone]

## 2013-03-07 LAB — COMPREHENSIVE METABOLIC PANEL
ALK PHOS: 95 U/L (ref 39–117)
ALT: 23 U/L (ref 0–35)
AST: 33 U/L (ref 0–37)
Albumin: 3 g/dL — ABNORMAL LOW (ref 3.5–5.2)
BUN: 5 mg/dL — AB (ref 6–23)
CALCIUM: 9.1 mg/dL (ref 8.4–10.5)
CO2: 28 mEq/L (ref 19–32)
Chloride: 102 mEq/L (ref 96–112)
Creatinine, Ser: 0.61 mg/dL (ref 0.50–1.10)
GFR calc non Af Amer: >90 mL/min (ref >90)
Glucose, Bld: 115 mg/dL — ABNORMAL HIGH (ref 70–99)
Potassium: 4.8 mEq/L (ref 3.7–5.3)
Sodium: 139 mEq/L (ref 137–147)
TOTAL PROTEIN: 6.4 g/dL (ref 6.0–8.3)
Total Bilirubin: 0.4 mg/dL (ref 0.3–1.2)

## 2013-03-07 LAB — LIPASE, BLOOD: Lipase: 49 U/L (ref 11–59)

## 2013-03-07 LAB — MAGNESIUM: MAGNESIUM: 1.7 mg/dL (ref 1.5–2.5)

## 2013-03-07 MED ORDER — ZOLPIDEM TARTRATE 5 MG PO TABS
5.0000 mg | ORAL_TABLET | Freq: Every evening | ORAL | Status: DC | PRN
  Administered 2013-03-07: 5 mg via ORAL
  Filled 2013-03-07: qty 1

## 2013-03-07 NOTE — Progress Notes (Signed)
[facility] PROGRESS NOTE    Julie Carson DVV:[id number] DOB: [birth year] DOA: 02/27/2013 PCP: Antony Blackbird, MD  HPI/Brief narrative 58 yo WF PMHx  alcohol dependence, major depression, GERD, HTN presented with several days history of nausea, vomiting, abdominal pain and hematemesis but no melena. She was admitted for alcoholic pancreatitis and upper GI bleed. She underwent EGD by GI on 1/9 which showed ulcerative esophagitis. 03/04/2013 currently states negative CP, negative SOB. States spoke with psychiatrist today but does not know what the plan of care entailed. States in the distant past saw outpatient psychiatrist but does not recall name. 03/05/2013 patient states did not tolerate liquid diet well. Positive nausea negative vomiting negative diarrhea but increased abdominal pain. Will make patient n.p.o. 03/06/2013 patient states has become hungry, still having mild abdominal pain. Requested no when we did advance diet. Will advance diet in the a.m. to clear liquids. 03/07/2013 patient consuming clear liquid diet with some mild nausea/abdominal pain  Assessment/Plan:  Alcoholic pancreatitis - Patient was admitted to the hospital, bowels were rested and she was placed on IV fluids and pain medications.  -Abdominal pain has decreased, will restart clear liquids in the a.m.  -Lipase WNL. -Advance diet to Clear Liquids. Advance as tolerated  Upper GI bleed secondary to ulcerative esophagitis - Confirmed by EGD on 1/9. Continue IV PPI and sucralfate. - Diet now n.p.o. except for ice chips    Hypokalemia/hypomagnesemia -Resolved  NSVT x1 - Likely secondary to electrolyte abnormalities. Continue monitoring on telemetry.   Diastolic CHF  -See below for results of echocardiogram .   Anemia/thrombocytopenia  - Likely chronic. Hemoglobin of 15.4 on admission was probably from hemoconcentration. Patient now has mild anemia -Thrombocytopenia old - Stable. Transfuse if hemoglobin less than 7  g/dL.  -Normal  B12, folate -methylmalonic acid; pending 60  History of depression - Patient denies suicidal or homicidal ideations. She says that she has a lot of stressors at home.  -Continue Celexa per psychiatry to 20 mg daily   Alcohol dependence & withdrawal - Continue Ativan protocol.  -DC'd scheduled Ativan, secondary to sedating effect; patient needs to be out of the bed with lights and shapes open during waking hours and for meals -DC Ativan patient has been in hospital for 7 days, likelihood of alcohol withdrawal extremely remote  Dehydration with hyponatremia - Improved. -If vitals, labs within normal limits in the a.m. will discharge  Insomnia -Patient if she would stay awake during the day ambulate the ward would start Ambien 5 mg QHS PRN     Code Status: Full Family Communication: None at bedside Disposition Plan: Home when medically stable   Consultants: Dr Laurence Spates (GI) Dr.ARFEEN,SYED T.( Psychiatry)   Procedures: Echocardiogram 03/01/2013 -Left ventricle: LVH.  -LVEF=60% to 65%.  -(grade 1 diastolic dysfunction). - Aortic valve: Valve area: 2.1cm^2(VTI). Valve area: 1.9cm^2 (Vmax). - Systemic veins: IVC appears small, consistent with low RA pressures and hypovolemia.  EGD 1/9; ulcerative esophagitis    Antibiotics:    Objective: Filed Vitals:   03/06/13 1310 03/06/13 2156 03/07/13 0224 03/07/13 0535  BP: 112/69 119/76 136/77 131/83  Pulse: 80 62 67 70  Temp: 98.1 F (36.7 C) 98.1 F (36.7 C)  98.8 F (37.1 C)  TempSrc: Oral Oral  Oral  Resp: 20 18  18   Height:      Weight:      SpO2: 98% 98% 98% 98%    Intake/Output Summary (Last 24 hours) at 03/07/13 1200 Last data filed at 03/07/13 0820  Gross per 24 hour  Intake   1540 ml  Output   1600 ml  Net    -60 ml   Filed Weights   02/27/13 1723  Weight: 59.3 kg (130 lb 11.7 oz)     Exam:  General exam: A./O. X4, NAD, positive anxiety Respiratory system: Clear to  auscultation bilateral. No increased work of breathing. Cardiovascular system: S1 & S2 heard, RRR. No JVD, murmurs, gallops, clicks or pedal edema.   Gastrointestinal system: Abdomen is nondistended, soft. Mild epigastric tenderness without peritoneal signs. Normal bowel sounds heard. Extremities: Symmetric 5 x 5 power.No tremors.    Data Reviewed: Basic Metabolic Panel:  Recent Labs Lab 03/01/13 0611 03/01/13 1805 03/02/13 0525 03/03/13 0520 03/04/13 0525 03/05/13 0515 03/07/13 0545  NA 135* 135* 135* 136* 137 137 139  K 2.4* 3.1* 3.1* 4.0 4.0 4.3 4.8  CL 94* 93* 97 100 100 100 102  CO2 32 33* 29 29 28 27 28   GLUCOSE 129* 110* 130* 113* 134* 129* 115*  BUN 5* 4* 3* <3* 4* 6 5*  CREATININE 0.53 0.47* 0.43* 0.48* 0.53 0.56 0.61  CALCIUM 8.0* 8.1* 7.7* 8.0* 8.4 8.4 9.1  MG 1.4* 2.5  --   --  1.6  --  1.7   Liver Function Tests:  Recent Labs Lab 03/01/13 0611 03/05/13 0515 03/07/13 0545  AST 44* 28 33  ALT 28 24 23   ALKPHOS 76 86 95  BILITOT 0.9 0.4 0.4  PROT 5.5* 5.7* 6.4  ALBUMIN 2.8* 2.7* 3.0*    Recent Labs Lab 03/05/13 0515 03/06/13 0535 03/07/13 0545  LIPASE 93* 70* 49   No results found for this basename: AMMONIA,  in the last 168 hours CBC:  Recent Labs Lab 03/01/13 0611 03/03/13 0520 03/05/13 0515  WBC 6.0 4.8 6.9  NEUTROABS  --   --  3.8  HGB 11.5* 11.3* 10.9*  HCT 32.7* 33.2* 33.5*  MCV 93.4 96.8 98.8  PLT 142* 174 244   Cardiac Enzymes: No results found for this basename: CKTOTAL, CKMB, CKMBINDEX, TROPONINI,  in the last 168 hours BNP (last 3 results) No results found for this basename: PROBNP,  in the last 8760 hours CBG: No results found for this basename: GLUCAP,  in the last 168 hours  No results found for this or any previous visit (from the past 240 hour(s)).    Studies: No results found.      Scheduled Meds: . citalopram  20 mg Oral Daily  . feeding supplement (RESOURCE BREEZE)  1 Container Oral TID BM  . folic acid   1 mg Oral Daily  . multivitamin with minerals  1 tablet Oral Daily  . pantoprazole (PROTONIX) IV  40 mg Intravenous Q12H  . sodium chloride  3 mL Intravenous Q12H  . sucralfate  1 g Oral TID WC & HS  . thiamine  100 mg Oral Daily   Or  . thiamine  100 mg Intravenous Daily   Continuous Infusions: . 0.9 % NaCl with KCl 20 mEq / L 100 mL/hr at 03/07/13 0245    Active Problems:   Hypokalemia   Pancreatitis   Nausea & vomiting   Alcohol abuse   Upper GI bleed   Hyponatremia   NSVT (nonsustained ventricular tachycardia)   Ulcerative esophagitis    Time spent: 40 minutes    Nyara Capell, Geraldo Docker, MD,  [facility] Pager [phone]  If 7PM-7AM, please contact night-coverage [email or web address] Password [email or web address] 03/07/2013, 12:00 PM    LOS: 8  days

## 2013-03-08 LAB — COMPREHENSIVE METABOLIC PANEL
ALBUMIN: 2.8 g/dL — AB (ref 3.5–5.2)
ALT: 20 U/L (ref 0–35)
AST: 25 U/L (ref 0–37)
Alkaline Phosphatase: 81 U/L (ref 39–117)
BUN: 3 mg/dL — AB (ref 6–23)
CO2: 25 mEq/L (ref 19–32)
Calcium: 8.7 mg/dL (ref 8.4–10.5)
Chloride: 105 mEq/L (ref 96–112)
Creatinine, Ser: 0.55 mg/dL (ref 0.50–1.10)
GFR calc Af Amer: >90 mL/min (ref >90)
GFR calc non Af Amer: >90 mL/min (ref >90)
Glucose, Bld: 109 mg/dL — ABNORMAL HIGH (ref 70–99)
POTASSIUM: 4 meq/L (ref 3.7–5.3)
SODIUM: 141 meq/L (ref 137–147)
TOTAL PROTEIN: 5.7 g/dL — AB (ref 6.0–8.3)
Total Bilirubin: 0.3 mg/dL (ref 0.3–1.2)

## 2013-03-08 LAB — MAGNESIUM: MAGNESIUM: 1.6 mg/dL (ref 1.5–2.5)

## 2013-03-08 LAB — LIPASE, BLOOD: LIPASE: 38 U/L (ref 11–59)

## 2013-03-08 MED ORDER — ZOLPIDEM TARTRATE 10 MG PO TABS
5.0000 mg | ORAL_TABLET | Freq: Every evening | ORAL | Status: DC | PRN
  Administered 2013-03-08: 5 mg via ORAL
  Filled 2013-03-08: qty 1

## 2013-03-08 MED ORDER — PANTOPRAZOLE SODIUM 40 MG PO TBEC
40.0000 mg | DELAYED_RELEASE_TABLET | Freq: Two times a day (BID) | ORAL | Status: DC
  Administered 2013-03-08 – 2013-03-09 (×2): 40 mg via ORAL
  Filled 2013-03-08 (×3): qty 1

## 2013-03-08 NOTE — Progress Notes (Signed)
UNable to print EKG strip.

## 2013-03-08 NOTE — Evaluation (Signed)
Physical Therapy Evaluation Patient Details Name: Julie Carson MRN: [id number] DOB: 1955/11/07 Today's Date: 03/08/2013 Time: 8676-1950 PT Time Calculation (min): 9 min  PT Assessment / Plan / Recommendation History of Present Illness  58 yo female admitted with pancreatitis, dizziness, weakness. Hx of anxiety, ADHD, osteopenia, PTSD, ETOH abuse and withdrawal.   Clinical Impression  On eval pt was mod I with mobility-able to ambulate ~100 feet. Pt pushed IV pole but did not rely on it. Pt reports ambulating in hallway unassisted and was agreeable to demonstrate this for therapist. No further acute PT needs at this time. Will sign off. Encouraged pt to continue to mobilize daily.     PT Assessment  Patent does not need any further PT services    Follow Up Recommendations  No PT follow up    Does the patient have the potential to tolerate intense rehabilitation      Barriers to Discharge        Equipment Recommendations  None recommended by PT    Recommendations for Other Services     Frequency      Precautions / Restrictions Precautions Precautions: Fall Restrictions Weight Bearing Restrictions: No   Pertinent Vitals/Pain Abdomen-unrated. Pt reports she received morphine earlier.       Mobility  Bed Mobility Overal bed mobility: Independent Transfers Overall transfer level: Modified independent Ambulation/Gait Ambulation/Gait assistance: Modified independent (Device/Increase time) Ambulation Distance (Feet): 100 Feet Assistive device:  (pt pushed IV pole) General Gait Details: no LOB    Exercises     PT Diagnosis:    PT Problem List:   PT Treatment Interventions:       PT Goals(Current goals can be found in the care plan section) Acute Rehab PT Goals Patient Stated Goal: get better.  PT Goal Formulation: No goals set, d/c therapy  Visit Information  Last PT Received On: 03/08/13 Assistance Needed: +1 History of Present Illness: 58 yo female admitted  with pancreatitis, dizziness, weakness. Hx of anxiety, ADHD, osteopenia, PTSD, ETOH abuse and withdrawal.        Prior Functioning  Home Living Family/patient expects to be discharged to:: Private residence Living Arrangements: Alone Home Equipment: None Prior Function Level of Independence: Independent Communication Communication: No difficulties    Cognition  Cognition Arousal/Alertness: Awake/alert Behavior During Therapy: WFL for tasks assessed/performed Overall Cognitive Status: Within Functional Limits for tasks assessed    Extremity/Trunk Assessment Upper Extremity Assessment Upper Extremity Assessment: Generalized weakness Lower Extremity Assessment Lower Extremity Assessment: Generalized weakness Cervical / Trunk Assessment Cervical / Trunk Assessment: Normal   Balance    End of Session PT - End of Session Activity Tolerance: Patient tolerated treatment well Patient left: in bed;with call bell/phone within reach  GP     Weston Anna, MPT Pager: [phone]

## 2013-03-08 NOTE — Discharge Summary (Addendum)
Physician Discharge Summary  Dudley Cooley NID:[id number] DOB: [birth year] DOA: 02/27/2013  PCP: Antony Blackbird, MD  Admit date: 02/27/2013 Discharge date: 03/08/2013  Time spent: 40 minute minutes  Recommendations for Outpatient Follow-up:   Alcoholic pancreatitis  - Patient was admitted to the hospital, bowels were rested and she was placed on IV fluids and pain medications. Currently patient tolerated breakfast of pancakes and eggs without N./V. just some minor abdominal pain. Ready for discharge -Lipase WNL.  -Consult slowly advancing diet after discharge, refraining from alcohol. Patient's AA sponsor will be picking her up.  -Patient was given serial on chemical dependency-intensive outpatient (CD-IOP) program by CSW   Upper GI bleed secondary to ulcerative esophagitis  - Confirmed by EGD on 1/9.  -Discharge on Protonix 40 mg BID -Discharge on sucralfate.   Hypokalemia/hypomagnesemia  -Resolved   NSVT x1  - Likely secondary to electrolyte abnormalities. Resolved    Diastolic CHF  -See below for results of echocardiogram  .  Anemia/thrombocytopenia  - Likely chronic. Hemoglobin of 15.4 on admission was probably from hemoconcentration. Patient now has mild anemia  -Thrombocytopenia old  - Stable. Transfuse if hemoglobin less than 7 g/dL.  -Normal B12, folate  -methylmalonic acid; normal -Patient's PCP will monitor and continue workup if necessary    History of depression  - Patient denies suicidal or homicidal ideations. She says that she has a lot of stressors at home.  -Continue Celexa per psychiatry 20 mg daily   Alcohol dependence & withdrawal  -chemical dependency-intensive outpatient (CD-IOP) program -Patient's AA sponsor will pick up patient  Dehydration with hyponatremia  - Resolved  -Lipase within normal limit; discharge    Insomnia  -Discharge with seven-day supply Ambien 10 mg QHS        Discharge Diagnoses:  Active Problems:   Hypokalemia    Pancreatitis   Nausea & vomiting   Alcohol abuse   Upper GI bleed   Hyponatremia   NSVT (nonsustained ventricular tachycardia)   Ulcerative esophagitis   Discharge Condition: Stable  Diet recommendation: Ardelle Lesches Weights   02/27/13 1723  Weight: 59.3 kg (130 lb 11.7 oz)    History of present illness:  58 yo WF PMHx alcohol dependence, major depression, GERD, HTN presented with several days history of nausea, vomiting, abdominal pain and hematemesis but no melena. She was admitted for alcoholic pancreatitis and upper GI bleed. She underwent EGD by GI on 1/9 which showed ulcerative esophagitis. 03/04/2013 currently states negative CP, negative SOB. States spoke with psychiatrist today but does not know what the plan of care entailed. States in the distant past saw outpatient psychiatrist but does not recall name. 03/05/2013 patient states did not tolerate liquid diet well. Positive nausea negative vomiting negative diarrhea but increased abdominal pain. Will make patient n.p.o. 03/06/2013 patient states has become hungry, still having mild abdominal pain. Requested no when we did advance diet. Will advance diet in the a.m. to clear liquids. 03/07/2013 patient consuming clear liquid diet with some mild nausea/abdominal pain 03/08/2013 patient states doing much better, ambulating during the daytime, still having some difficulty sleeping at night. 03/09/2013 patient states ate a large breakfast this morning without N./V. and only mild abdominal pain.    Consultants:  Dr Laurence Spates (GI)  Dr.ARFEEN,SYED T.( Psychiatry)    Procedures:  Echocardiogram 03/01/2013  -Left ventricle: LVH.  -LVEF=60% to 65%.  -(grade 1 diastolic dysfunction). - Aortic valve: Valve area: 2.1cm^2(VTI). Valve area: 1.9cm^2 (Vmax). - Systemic veins: IVC appears small, consistent with low  RA pressures and hypovolemia.   EGD 1/9; ulcerative esophagitis    Antibiotics:    Discharge Exam: Filed Vitals:    03/07/13 1400 03/07/13 2214 03/08/13 0541 03/08/13 1348  BP: 119/69 107/66 103/65 128/82  Pulse: 67 64 57 65  Temp: 98.1 F (36.7 C) 98.4 F (36.9 C) 98.6 F (37 C) 98.8 F (37.1 C)  TempSrc: Oral Oral Oral Oral  Resp: 18 18 18 18   Height:      Weight:      SpO2: 97% 99% 95% 98%   General exam: A./O. X4, NAD, positive anxiety  Respiratory system: Clear to auscultation bilateral. No increased work of breathing.  Cardiovascular system: S1 & S2 heard, RRR. No JVD, murmurs, gallops, clicks or pedal edema.  Gastrointestinal system: Abdomen is nondistended, soft. Mild epigastric tenderness without peritoneal signs. Normal bowel sounds heard.  Extremities: Symmetric 5 x 5 power.mild tremors    Discharge Instructions     Medication List    Notice   You have not been prescribed any medications.     No Known Allergies    The results of significant diagnostics from this hospitalization (including imaging, microbiology, ancillary and laboratory) are listed below for reference.    Significant Diagnostic Studies: Dg Chest 2 View  02/27/2013   CLINICAL DATA:  Chest pain, throwing up blood  EXAM: CHEST  2 VIEW  COMPARISON:  DG CHEST 2 VIEW dated 04/14/2012  FINDINGS: The heart size and mediastinal contours are within normal limits. Both lungs are clear. The visualized skeletal structures are unremarkable.  IMPRESSION: No active cardiopulmonary disease.   Electronically Signed   By: Kathreen Devoid   On: 02/27/2013 10:20    Microbiology: No results found for this or any previous visit (from the past 240 hour(s)).   Labs: Basic Metabolic Panel:  Recent Labs Lab 03/03/13 0520 03/04/13 0525 03/05/13 0515 03/07/13 0545 03/08/13 0524  NA 136* 137 137 139 141  K 4.0 4.0 4.3 4.8 4.0  CL 100 100 100 102 105  CO2 29 28 27 28 25   GLUCOSE 113* 134* 129* 115* 109*  BUN <3* 4* 6 5* 3*  CREATININE 0.48* 0.53 0.56 0.61 0.55  CALCIUM 8.0* 8.4 8.4 9.1 8.7  MG  --  1.6  --  1.7 1.6   Liver  Function Tests:  Recent Labs Lab 03/05/13 0515 03/07/13 0545 03/08/13 0524  AST 28 33 25  ALT 24 23 20   ALKPHOS 86 95 81  BILITOT 0.4 0.4 0.3  PROT 5.7* 6.4 5.7*  ALBUMIN 2.7* 3.0* 2.8*    Recent Labs Lab 03/05/13 0515 03/06/13 0535 03/07/13 0545 03/08/13 0524  LIPASE 93* 70* 49 38   No results found for this basename: AMMONIA,  in the last 168 hours CBC:  Recent Labs Lab 03/03/13 0520 03/05/13 0515  WBC 4.8 6.9  NEUTROABS  --  3.8  HGB 11.3* 10.9*  HCT 33.2* 33.5*  MCV 96.8 98.8  PLT 174 244   Cardiac Enzymes: No results found for this basename: CKTOTAL, CKMB, CKMBINDEX, TROPONINI,  in the last 168 hours BNP: BNP (last 3 results) No results found for this basename: PROBNP,  in the last 8760 hours CBG: No results found for this basename: GLUCAP,  in the last 168 hours     Signed:  Dia Crawford, MD [facility] [phone] pager

## 2013-03-09 LAB — COMPREHENSIVE METABOLIC PANEL
ALBUMIN: 3.2 g/dL — AB (ref 3.5–5.2)
ALT: 20 U/L (ref 0–35)
AST: 25 U/L (ref 0–37)
Alkaline Phosphatase: 90 U/L (ref 39–117)
BILIRUBIN TOTAL: 0.4 mg/dL (ref 0.3–1.2)
CHLORIDE: 103 meq/L (ref 96–112)
CO2: 26 mEq/L (ref 19–32)
Calcium: 9.1 mg/dL (ref 8.4–10.5)
Creatinine, Ser: 0.58 mg/dL (ref 0.50–1.10)
GFR calc Af Amer: >90 mL/min (ref >90)
GFR calc non Af Amer: >90 mL/min (ref >90)
Glucose, Bld: 109 mg/dL — ABNORMAL HIGH (ref 70–99)
Potassium: 4.4 mEq/L (ref 3.7–5.3)
Sodium: 141 mEq/L (ref 137–147)
Total Protein: 6.3 g/dL (ref 6.0–8.3)

## 2013-03-09 LAB — MAGNESIUM: Magnesium: 1.6 mg/dL (ref 1.5–2.5)

## 2013-03-09 LAB — LIPASE, BLOOD: LIPASE: 56 U/L (ref 11–59)

## 2013-03-09 LAB — METHYLMALONIC ACID, SERUM: Methylmalonic Acid, Quantitative: 0.2 umol/L (ref <0.40)

## 2013-03-09 MED ORDER — SUCRALFATE 1 GM/10ML PO SUSP: 1.0000 g | Freq: Three times a day (TID) | ORAL | Status: DC

## 2013-03-09 MED ORDER — CITALOPRAM HYDROBROMIDE 20 MG PO TABS: 20.0000 mg | ORAL_TABLET | Freq: Every day | ORAL | Status: DC

## 2013-03-09 MED ORDER — ZOLPIDEM TARTRATE 5 MG PO TABS: 10.0000 mg | ORAL_TABLET | Freq: Every evening | ORAL | Status: DC | PRN

## 2013-03-09 MED ORDER — PANTOPRAZOLE SODIUM 40 MG PO TBEC: 40.0000 mg | DELAYED_RELEASE_TABLET | Freq: Two times a day (BID) | ORAL | Status: DC

## 2013-03-09 MED ORDER — FOLIC ACID 1 MG PO TABS: 1.0000 mg | ORAL_TABLET | Freq: Every day | ORAL | Status: DC

## 2013-03-09 MED ORDER — OXYCODONE HCL 5 MG PO TABS: 5.0000 mg | ORAL_TABLET | ORAL | Status: DC | PRN

## 2013-03-09 NOTE — Progress Notes (Signed)
Patient appearing tachycardic via telemetry, patient washing hair standing at the sink at this time.

## 2013-03-09 NOTE — Progress Notes (Signed)
Clinical Social Work  CSW met with patient at bedside. Patient up and ambulating in the hallway and reports she is feeling better but still weak. Patient wanted to speak with CSW about plans at DC and family concerns. Patient reports that mom came to visit last week and it was a good visit. Patient feels guilty about mom being placed at ALF and feels that she has not taken care of parents house like she should have. Patient and brother have a strained relationship due to different opinions regarding care of mom. Patient reports that when she tries to mend the relationship with brother that she gets upset and feels that depression worsens. CSW and patient discussed relying on healthy relationships and working with sponsor for additional assistance. Patient plans to return to [name] with sponsor Julie Carson) and plans to consider CD IOP. Patient has [facility] referral and reports when she is feeling stronger she will attend.  Patient engaged during assessment with appropriate behavior. Patient thanked CSW for time and reports she is motivated to remain sober once she returns home.  CSW will continue to follow.  [place], [place] [phone]

## 2013-03-09 NOTE — Evaluation (Signed)
Occupational Therapy Evaluation Patient Details Name: Julie Carson MRN: [id number] DOB: [birth year] Today's Date: 03/09/2013 Time: 4098-1191 OT Time Calculation (min): 38 min  OT Assessment / Plan / Recommendation History of present illness 58 yo female admitted with pancreatitis, dizziness, weakness. Hx of anxiety, ADHD, osteopenia, PTSD, ETOH abuse and withdrawal.    Clinical Impression   Pt is performing mobility and  ADL at a modified independent level.  Educated in energy conservation strategies.  Pt with many stressors in her life with recent death of her father, mother in assisted living.      OT Assessment  Patient does not need any further OT services    Follow Up Recommendations  No OT follow up    Barriers to Discharge      Equipment Recommendations  None recommended by OT    Recommendations for Other Services    Frequency       Precautions / Restrictions Restrictions Weight Bearing Restrictions: No   Pertinent Vitals/Pain VSS, no pain    ADL  Eating/Feeding: Independent Where Assessed - Eating/Feeding: Edge of bed Grooming: Wash/dry hands;Wash/dry face;Independent Where Assessed - Grooming: Unsupported standing Upper Body Bathing: Independent Where Assessed - Upper Body Bathing: Unsupported sitting Lower Body Bathing: Independent Where Assessed - Lower Body Bathing: Unsupported sitting;Unsupported standing Upper Body Dressing: Independent Where Assessed - Upper Body Dressing: Unsupported sitting Lower Body Dressing: Independent Where Assessed - Lower Body Dressing: Unsupported sitting;Unsupported standing Toilet Transfer: Modified independent Toilet Transfer Method: Sit to Loss adjuster, chartered: Comfort height toilet Toileting - Water quality scientist and Hygiene: Independent Where Assessed - Best boy and Hygiene: Lean right and/or left Tub/Shower Transfer: Modified independent Tub/Shower Transfer Method:  Ambulating Transfers/Ambulation Related to ADLs: mod I holding IV pole ADL Comments: Educated pt in energy conservation strategies.    OT Diagnosis:    OT Problem List:   OT Treatment Interventions:     OT Goals(Current goals can be found in the care plan section) Acute Rehab OT Goals Patient Stated Goal: return home hopefully today  Visit Information  Last OT Received On: 03/09/13 Assistance Needed: +1 History of Present Illness: 58 yo female admitted with pancreatitis, dizziness, weakness. Hx of anxiety, ADHD, osteopenia, PTSD, ETOH abuse and withdrawal.        Prior [place] expects to be discharged to:: Private residence Living Arrangements: Alone Available Help at Discharge: Friend(s);Available PRN/intermittently Type of Home: House Home Equipment: Shower seat Prior Function Level of Independence: Independent Communication Communication: No difficulties Dominant Hand: Right         Vision/Perception Vision - History Baseline Vision: Wears glasses only for reading Patient Visual Report: No change from baseline   Cognition  Cognition Arousal/Alertness: Awake/alert Behavior During Therapy: WFL for tasks assessed/performed Overall Cognitive Status: Within Functional Limits for tasks assessed    Extremity/Trunk Assessment Upper Extremity Assessment Upper Extremity Assessment: Generalized weakness (mild tremor) Lower Extremity Assessment Lower Extremity Assessment: Generalized weakness Cervical / Trunk Assessment Cervical / Trunk Assessment: Normal     Mobility Bed Mobility Overal bed mobility: Independent Transfers Overall transfer level: Modified independent     Exercise     Balance     End of Session OT - End of Session Activity Tolerance: Patient tolerated treatment well Patient left: in bed;with call bell/phone within reach  GO     Malka So 03/09/2013, 9:32 AM [phone]

## 2013-05-24 ENCOUNTER — Emergency Department (HOSPITAL_COMMUNITY)
Admission: EM | Admit: 2013-05-24 | Discharge: 2013-05-24 | Disposition: A | Discharge status: Home / Self Care | Payer: Self-pay | Attending: Emergency Medicine | Admitting: Emergency Medicine

## 2013-05-24 ENCOUNTER — Encounter (HOSPITAL_COMMUNITY): Payer: Self-pay | Admitting: Emergency Medicine

## 2013-05-24 ENCOUNTER — Emergency Department (HOSPITAL_COMMUNITY): Payer: Self-pay

## 2013-05-24 DIAGNOSIS — F411 Generalized anxiety disorder: Secondary | ICD-10-CM | POA: Insufficient documentation

## 2013-05-24 DIAGNOSIS — Z85828 Personal history of other malignant neoplasm of skin: Secondary | ICD-10-CM | POA: Insufficient documentation

## 2013-05-24 DIAGNOSIS — Z79899 Other long term (current) drug therapy: Secondary | ICD-10-CM | POA: Insufficient documentation

## 2013-05-24 DIAGNOSIS — E876 Hypokalemia: Secondary | ICD-10-CM | POA: Insufficient documentation

## 2013-05-24 DIAGNOSIS — F17200 Nicotine dependence, unspecified, uncomplicated: Secondary | ICD-10-CM | POA: Insufficient documentation

## 2013-05-24 DIAGNOSIS — N390 Urinary tract infection, site not specified: Secondary | ICD-10-CM | POA: Insufficient documentation

## 2013-05-24 DIAGNOSIS — Z872 Personal history of diseases of the skin and subcutaneous tissue: Secondary | ICD-10-CM | POA: Insufficient documentation

## 2013-05-24 DIAGNOSIS — I10 Essential (primary) hypertension: Secondary | ICD-10-CM | POA: Insufficient documentation

## 2013-05-24 DIAGNOSIS — K2920 Alcoholic gastritis without bleeding: Secondary | ICD-10-CM | POA: Insufficient documentation

## 2013-05-24 DIAGNOSIS — Z8701 Personal history of pneumonia (recurrent): Secondary | ICD-10-CM | POA: Insufficient documentation

## 2013-05-24 DIAGNOSIS — R002 Palpitations: Secondary | ICD-10-CM | POA: Insufficient documentation

## 2013-05-24 DIAGNOSIS — Z8739 Personal history of other diseases of the musculoskeletal system and connective tissue: Secondary | ICD-10-CM | POA: Insufficient documentation

## 2013-05-24 LAB — COMPREHENSIVE METABOLIC PANEL
ALT: 70 U/L — AB (ref 0–35)
AST: 255 U/L — AB (ref 0–37)
Albumin: 4.1 g/dL (ref 3.5–5.2)
Alkaline Phosphatase: 183 U/L — ABNORMAL HIGH (ref 39–117)
BILIRUBIN TOTAL: 1.8 mg/dL — AB (ref 0.3–1.2)
BUN: 14 mg/dL (ref 6–23)
CHLORIDE: 84 meq/L — AB (ref 96–112)
CO2: 24 meq/L (ref 19–32)
Calcium: 9.5 mg/dL (ref 8.4–10.5)
Creatinine, Ser: 0.67 mg/dL (ref 0.50–1.10)
GFR calc Af Amer: >90 mL/min (ref >90)
GFR calc non Af Amer: >90 mL/min (ref >90)
Glucose, Bld: 174 mg/dL — ABNORMAL HIGH (ref 70–99)
POTASSIUM: 3.1 meq/L — AB (ref 3.7–5.3)
SODIUM: 136 meq/L — AB (ref 137–147)
Total Protein: 8.1 g/dL (ref 6.0–8.3)

## 2013-05-24 LAB — URINE MICROSCOPIC-ADD ON

## 2013-05-24 LAB — URINALYSIS, ROUTINE W REFLEX MICROSCOPIC
GLUCOSE, UA: NEGATIVE mg/dL
HGB URINE DIPSTICK: NEGATIVE
KETONES UR: 15 mg/dL — AB
Nitrite: NEGATIVE
Protein, ur: 30 mg/dL — AB
Specific Gravity, Urine: 1.022 (ref 1.005–1.030)
Urobilinogen, UA: 1 mg/dL (ref 0.0–1.0)
pH: 5.5 (ref 5.0–8.0)

## 2013-05-24 LAB — CBC WITH DIFFERENTIAL/PLATELET
BASOS ABS: 0 10*3/uL (ref 0.0–0.1)
BASOS PCT: 1 % (ref 0–1)
Eosinophils Absolute: 0.1 10*3/uL (ref 0.0–0.7)
Eosinophils Relative: 2 % (ref 0–5)
HCT: 39.3 % (ref 36.0–46.0)
Hemoglobin: 13.5 g/dL (ref 12.0–15.0)
LYMPHS PCT: 39 % (ref 12–46)
Lymphs Abs: 1.8 10*3/uL (ref 0.7–4.0)
MCH: 33.3 pg (ref 26.0–34.0)
MCHC: 34.4 g/dL (ref 30.0–36.0)
MCV: 96.8 fL (ref 78.0–100.0)
Monocytes Absolute: 0.4 10*3/uL (ref 0.1–1.0)
Monocytes Relative: 10 % (ref 3–12)
NEUTROS ABS: 2.2 10*3/uL (ref 1.7–7.7)
NEUTROS PCT: 48 % (ref 43–77)
PLATELETS: 172 10*3/uL (ref 150–400)
RBC: 4.06 MIL/uL (ref 3.87–5.11)
RDW: 13.6 % (ref 11.5–15.5)
WBC: 4.5 10*3/uL (ref 4.0–10.5)

## 2013-05-24 LAB — LIPASE, BLOOD: Lipase: 43 U/L (ref 11–59)

## 2013-05-24 LAB — ETHANOL: ALCOHOL ETHYL (B): 17 mg/dL — AB (ref 0–11)

## 2013-05-24 MED ORDER — FAMOTIDINE IN NACL 20-0.9 MG/50ML-% IV SOLN
20.0000 mg | Freq: Once | INTRAVENOUS | Status: AC
  Administered 2013-05-24: 20 mg via INTRAVENOUS
  Filled 2013-05-24: qty 50

## 2013-05-24 MED ORDER — POTASSIUM CHLORIDE 10 MEQ/100ML IV SOLN
10.0000 meq | Freq: Once | INTRAVENOUS | Status: AC
  Administered 2013-05-24: 10 meq via INTRAVENOUS
  Filled 2013-05-24: qty 100

## 2013-05-24 MED ORDER — SUCRALFATE 1 G PO TABS: 1.0000 g | ORAL_TABLET | Freq: Four times a day (QID) | ORAL | Status: AC

## 2013-05-24 MED ORDER — HYDROMORPHONE HCL PF 1 MG/ML IJ SOLN
1.0000 mg | Freq: Once | INTRAMUSCULAR | Status: AC
  Administered 2013-05-24: 1 mg via INTRAVENOUS
  Filled 2013-05-24: qty 1

## 2013-05-24 MED ORDER — POTASSIUM CHLORIDE CRYS ER 20 MEQ PO TBCR: 20.0000 meq | EXTENDED_RELEASE_TABLET | Freq: Every day | ORAL | Status: AC

## 2013-05-24 MED ORDER — CEPHALEXIN 500 MG PO CAPS: 500.0000 mg | ORAL_CAPSULE | Freq: Four times a day (QID) | ORAL | Status: AC

## 2013-05-24 MED ORDER — SODIUM CHLORIDE 0.9 % IV SOLN
INTRAVENOUS | Status: DC
  Administered 2013-05-24: 125 mL/h via INTRAVENOUS

## 2013-05-24 MED ORDER — GI COCKTAIL ~~LOC~~
30.0000 mL | Freq: Once | ORAL | Status: AC
  Administered 2013-05-24: 30 mL via ORAL
  Filled 2013-05-24: qty 30

## 2013-05-24 MED ORDER — PANTOPRAZOLE SODIUM 20 MG PO TBEC: 20.0000 mg | DELAYED_RELEASE_TABLET | Freq: Every day | ORAL | Status: AC

## 2013-05-24 MED ORDER — ONDANSETRON HCL 4 MG/2ML IJ SOLN
4.0000 mg | Freq: Once | INTRAMUSCULAR | Status: AC
  Administered 2013-05-24: 4 mg via INTRAVENOUS
  Filled 2013-05-24: qty 2

## 2013-05-24 MED ORDER — SODIUM CHLORIDE 0.9 % IV BOLUS (SEPSIS)
1000.0000 mL | Freq: Once | INTRAVENOUS | Status: AC
  Administered 2013-05-24: 1000 mL via INTRAVENOUS

## 2013-05-24 NOTE — ED Provider Notes (Signed)
CSN: [id number]     Arrival date & time 05/24/13  1639 History   First MD Initiated Contact with Patient 05/24/13 1649     No chief complaint on file.    (Consider location/radiation/quality/duration/timing/severity/associated sxs/prior Treatment) The history is provided by the patient.   patient here complaining of abdominal pain and emesis x5 days. She drinks alcohol daily consisting of 2 glasses of vodka. Does have a history of alcoholic pancreatitis. Has noted some blood in her emesis but denies any black stools. Her epigastric pain is characterized as burning sensation worse after she eats or drinks. She notes diffuse weakness as well as palpitations. Denies any syncope or near-syncope. Was hospitalized for pancreatitis 3 months ago. Her symptoms have been gradually worsening and nothing improves them.  Past Medical History  Diagnosis Date  . Anxiety   . GERD (gastroesophageal reflux disease)   . PTSD (post-traumatic stress disorder)   . ADHD (attention deficit hyperactivity disorder)   . Migraine   . UTI (urinary tract infection)   . Hypertension   . Allergy   . Lesion of subcutaneous tissue 2013    Deltoid  . Pneumonia   . Animal dander allergy   . Arthritis   . Osteopenia    Past Surgical History  Procedure Laterality Date  . Breast enhancement surgery    . Liposuction    . Basal cell carcinoma excision      L neck and shoulder  . Nose surgery    . Mass excision  10/24/2011    Procedure: EXCISION MASS;  Surgeon: Leighton Ruff, MD;  Location: [facility];  Service: General;  Laterality: Left;  . Esophagogastroduodenoscopy N/A 02/27/2013    Procedure: ESOPHAGOGASTRODUODENOSCOPY (EGD);  Surgeon: Winfield Cunas., MD;  Location: [facility];  Service: Endoscopy;  Laterality: N/A;   No family history on file. History  Substance Use Topics  . Smoking status: Current Every Day Smoker    Types: Cigarettes  . Smokeless tobacco: Never Used  . Alcohol Use: Yes     Comment: daily  alcohol intake   OB History   Grav Para Term Preterm Abortions TAB SAB Ect Mult Living                 Review of Systems  All other systems reviewed and are negative.      Allergies  Review of patient's allergies indicates no known allergies.  Home Medications   Current Outpatient Rx  Name  Route  Sig  Dispense  Refill  . ALPRAZolam (XANAX) 0.5 MG tablet   Oral   Take 0.5 mg by mouth 3 (three) times daily as needed for anxiety or sleep.          There were no vitals taken for this visit. Physical Exam  Nursing note and vitals reviewed. Constitutional: She is oriented to person, place, and time. She appears well-developed and well-nourished.  Non-toxic appearance. No distress.  HENT:  Head: Normocephalic and atraumatic.  Eyes: Conjunctivae, EOM and lids are normal. Pupils are equal, round, and reactive to light.  Neck: Normal range of motion. Neck supple. No tracheal deviation present. No mass present.  Cardiovascular: Normal rate, regular rhythm and normal heart sounds.  Exam reveals no gallop.   No murmur heard. Pulmonary/Chest: Effort normal and breath sounds normal. No stridor. No respiratory distress. She has no decreased breath sounds. She has no wheezes. She has no rhonchi. She has no rales.  Abdominal: Soft. Normal appearance and bowel sounds are normal.  She exhibits no distension. There is tenderness in the epigastric area. There is no rigidity, no rebound, no guarding and no CVA tenderness.    Musculoskeletal: Normal range of motion. She exhibits no edema and no tenderness.  Neurological: She is alert and oriented to person, place, and time. She has normal strength. No cranial nerve deficit or sensory deficit. GCS eye subscore is 4. GCS verbal subscore is 5. GCS motor subscore is 6.  Skin: Skin is warm and dry. No abrasion and no rash noted.  Psychiatric: She has a normal mood and affect. Her speech is normal and behavior is normal.    ED Course  Procedures  (including critical care time) Labs Review Labs Reviewed  ETHANOL  CBC WITH DIFFERENTIAL  COMPREHENSIVE METABOLIC PANEL  LIPASE, BLOOD  URINALYSIS, ROUTINE W REFLEX MICROSCOPIC   Imaging Review No results found.   EKG Interpretation None      MDM   Final diagnoses:  None    Patient given IV fluids and Pepcid. Patient's hypokalemia treated with IV potassium. Patient likely with alcoholic gastritis. Will place patient on prescription for antibiotics for possible early urinary tract infection. Transaminase is elevated likely from chronic alcohol abuse. No evidence of pancreatitis here. Patient to be discharged to home on potassium as well as antibiotics   Leota Jacobsen, MD 05/24/13 [phone]

## 2013-05-24 NOTE — ED Notes (Signed)
Pt in XRAY 

## 2013-05-24 NOTE — Discharge Instructions (Signed)
Gastritis, Adult Gastritis is soreness and swelling (inflammation) of the lining of the stomach. Gastritis can develop as a sudden onset (acute) or long-term (chronic) condition. If gastritis is not treated, it can lead to stomach bleeding and ulcers. CAUSES  Gastritis occurs when the stomach lining is weak or damaged. Digestive juices from the stomach then inflame the weakened stomach lining. The stomach lining may be weak or damaged due to viral or bacterial infections. One common bacterial infection is the Helicobacter pylori infection. Gastritis can also result from excessive alcohol consumption, taking certain medicines, or having too much acid in the stomach.  SYMPTOMS  In some cases, there are no symptoms. When symptoms are present, they may include:  Pain or a burning sensation in the upper abdomen.  Nausea.  Vomiting.  An uncomfortable feeling of fullness after eating. DIAGNOSIS  Your caregiver may suspect you have gastritis based on your symptoms and a physical exam. To determine the cause of your gastritis, your caregiver may perform the following:  Blood or stool tests to check for the H pylori bacterium.  Gastroscopy. A thin, flexible tube (endoscope) is passed down the esophagus and into the stomach. The endoscope has a light and camera on the end. Your caregiver uses the endoscope to view the inside of the stomach.  Taking a tissue sample (biopsy) from the stomach to examine under a microscope. TREATMENT  Depending on the cause of your gastritis, medicines may be prescribed. If you have a bacterial infection, such as an H pylori infection, antibiotics may be given. If your gastritis is caused by too much acid in the stomach, H2 blockers or antacids may be given. Your caregiver may recommend that you stop taking aspirin, ibuprofen, or other nonsteroidal anti-inflammatory drugs (NSAIDs). HOME CARE INSTRUCTIONS  Only take over-the-counter or prescription medicines as directed by  your caregiver.  If you were given antibiotic medicines, take them as directed. Finish them even if you start to feel better.  Drink enough fluids to keep your urine clear or pale yellow.  Avoid foods and drinks that make your symptoms worse, such as:  Caffeine or alcoholic drinks.  Chocolate.  Peppermint or mint flavorings.  Garlic and onions.  Spicy foods.  Citrus fruits, such as oranges, lemons, or limes.  Tomato-based foods such as sauce, chili, salsa, and pizza.  Fried and fatty foods.  Eat small, frequent meals instead of large meals. SEEK IMMEDIATE MEDICAL CARE IF:   You have black or dark red stools.  You vomit blood or material that looks like coffee grounds.  You are unable to keep fluids down.  Your abdominal pain gets worse.  You have a fever.  You do not feel better after 1 week.  You have any other questions or concerns. MAKE SURE YOU:  Understand these instructions.  Will watch your condition.  Will get help right away if you are not doing well or get worse. Document Released: 01/30/2001 Document Revised: 08/07/2011 Document Reviewed: 03/21/2011 [facility] Patient Information 2014 [place]. Hypokalemia Hypokalemia means that the amount of potassium in the blood is lower than normal.Potassium is a chemical, called an electrolyte, that helps regulate the amount of fluid in the body. It also stimulates muscle contraction and helps nerves function properly.Most of the body's potassium is inside of cells, and only a very small amount is in the blood. Because the amount in the blood is so small, minor changes can be life-threatening. CAUSES  Antibiotics.  Diarrhea or vomiting.  Using laxatives too  much, which can cause diarrhea.  Chronic kidney disease.  Water pills (diuretics).  Eating disorders (bulimia).  Low magnesium level.  Sweating a lot. SIGNS AND SYMPTOMS  Weakness.  Constipation.  Fatigue.  Muscle cramps.  Mental  confusion.  Skipped heartbeats or irregular heartbeat (palpitations).  Tingling or numbness. DIAGNOSIS  Your health care provider can diagnose hypokalemia with blood tests. In addition to checking your potassium level, your health care provider may also check other lab tests. TREATMENT Hypokalemia can be treated with potassium supplements taken by mouth or adjustments in your current medicines. If your potassium level is very low, you may need to get potassium through a vein (IV) and be monitored in the hospital. A diet high in potassium is also helpful. Foods high in potassium are:  Nuts, such as peanuts and pistachios.  Seeds, such as sunflower seeds and pumpkin seeds.  Peas, lentils, and lima beans.  Whole grain and bran cereals and breads.  Fresh fruit and vegetables, such as apricots, avocado, bananas, cantaloupe, kiwi, oranges, tomatoes, asparagus, and potatoes.  Orange and tomato juices.  Red meats.  Fruit yogurt. HOME CARE INSTRUCTIONS  Take all medicines as prescribed by your health care provider.  Maintain a healthy diet by including nutritious food, such as fruits, vegetables, nuts, whole grains, and lean meats.  If you are taking a laxative, be sure to follow the directions on the label. SEEK MEDICAL CARE IF:  Your weakness gets worse.  You feel your heart pounding or racing.  You are vomiting or having diarrhea.  You are diabetic and having trouble keeping your blood glucose in the normal range. SEEK IMMEDIATE MEDICAL CARE IF:  You have chest pain, shortness of breath, or dizziness.  You are vomiting or having diarrhea for more than 2 days.  You faint. MAKE SURE YOU:   Understand these instructions.  Will watch your condition.  Will get help right away if you are not doing well or get worse. Document Released: 02/05/2005 Document Revised: 11/26/2012 Document Reviewed: 08/08/2012 [facility] Patient Information 2014 [place]. Urinary Tract  Infection Urinary tract infections (UTIs) can develop anywhere along your urinary tract. Your urinary tract is your body's drainage system for removing wastes and extra water. Your urinary tract includes two kidneys, two ureters, a bladder, and a urethra. Your kidneys are a pair of bean-shaped organs. Each kidney is about the size of your fist. They are located below your ribs, one on each side of your spine. CAUSES Infections are caused by microbes, which are microscopic organisms, including fungi, viruses, and bacteria. These organisms are so small that they can only be seen through a microscope. Bacteria are the microbes that most commonly cause UTIs. SYMPTOMS  Symptoms of UTIs may vary by age and gender of the patient and by the location of the infection. Symptoms in young women typically include a frequent and intense urge to urinate and a painful, burning feeling in the bladder or urethra during urination. Older women and men are more likely to be tired, shaky, and weak and have muscle aches and abdominal pain. A fever may mean the infection is in your kidneys. Other symptoms of a kidney infection include pain in your back or sides below the ribs, nausea, and vomiting. DIAGNOSIS To diagnose a UTI, your caregiver will ask you about your symptoms. Your caregiver also will ask to provide a urine sample. The urine sample will be tested for bacteria and white blood cells. White blood cells are made by your  body to help fight infection. TREATMENT  Typically, UTIs can be treated with medication. Because most UTIs are caused by a bacterial infection, they usually can be treated with the use of antibiotics. The choice of antibiotic and length of treatment depend on your symptoms and the type of bacteria causing your infection. HOME CARE INSTRUCTIONS  If you were prescribed antibiotics, take them exactly as your caregiver instructs you. Finish the medication even if you feel better after you have only taken  some of the medication.  Drink enough water and fluids to keep your urine clear or pale yellow.  Avoid caffeine, tea, and carbonated beverages. They tend to irritate your bladder.  Empty your bladder often. Avoid holding urine for long periods of time.  Empty your bladder before and after sexual intercourse.  After a bowel movement, women should cleanse from front to back. Use each tissue only once. SEEK MEDICAL CARE IF:   You have back pain.  You develop a fever.  Your symptoms do not begin to resolve within 3 days. SEEK IMMEDIATE MEDICAL CARE IF:   You have severe back pain or lower abdominal pain.  You develop chills.  You have nausea or vomiting.  You have continued burning or discomfort with urination. MAKE SURE YOU:   Understand these instructions.  Will watch your condition.  Will get help right away if you are not doing well or get worse. Document Released: 11/15/2004 Document Revised: 08/07/2011 Document Reviewed: 03/16/2011 [facility] Patient Information 2014 [place].

## 2013-05-24 NOTE — ED Notes (Signed)
Pt from home c/o abdominal pain, nausea and vomiting for 1 week. She has had a loss in appetite for the past week in a half. She has a hx of pancreatitis from ETOH use. She reports normal alcohol consumption is about  "a couple glasses of vodka a day" she goes through 1/5 of vodka in 5 days. Last drink was this am in which she states she vomited afterward. Today she had hematemesis several times. 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

## 2013-05-24 NOTE — ED Notes (Signed)
Pt ambulating independently w/ steady gait on d/c in no acute distress, A&Ox4. D/c instructions reviewed w/ pt - pt denies any further questions or concerns at present. Rx given x4  

## 2013-08-16 IMAGING — CR DG CHEST 2V
2 series · 2 of 2 positions shown · non-contrast
Comparison: 10/17/2011, chest MRI 08/30/2011

CLINICAL DATA: Myalgia, nausea, vomiting, medical clearance

CHEST - 2 VIEW

[x chest ap]
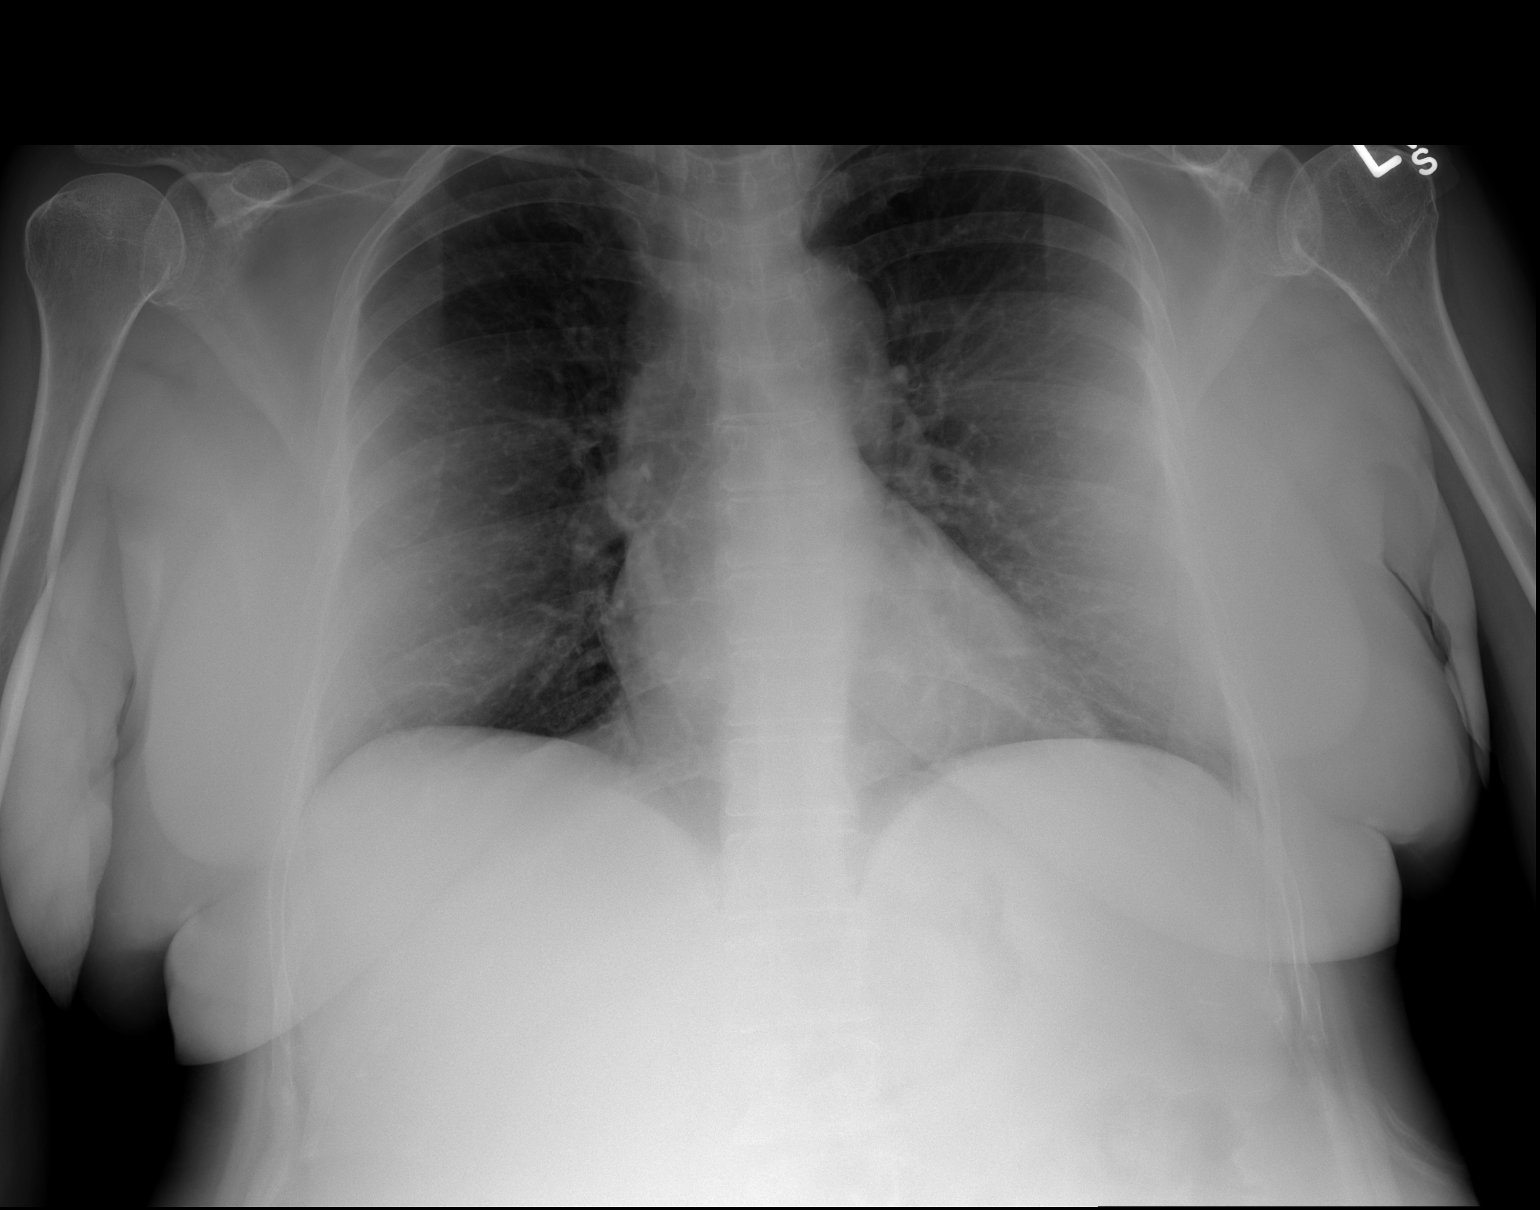

[w chest lat]
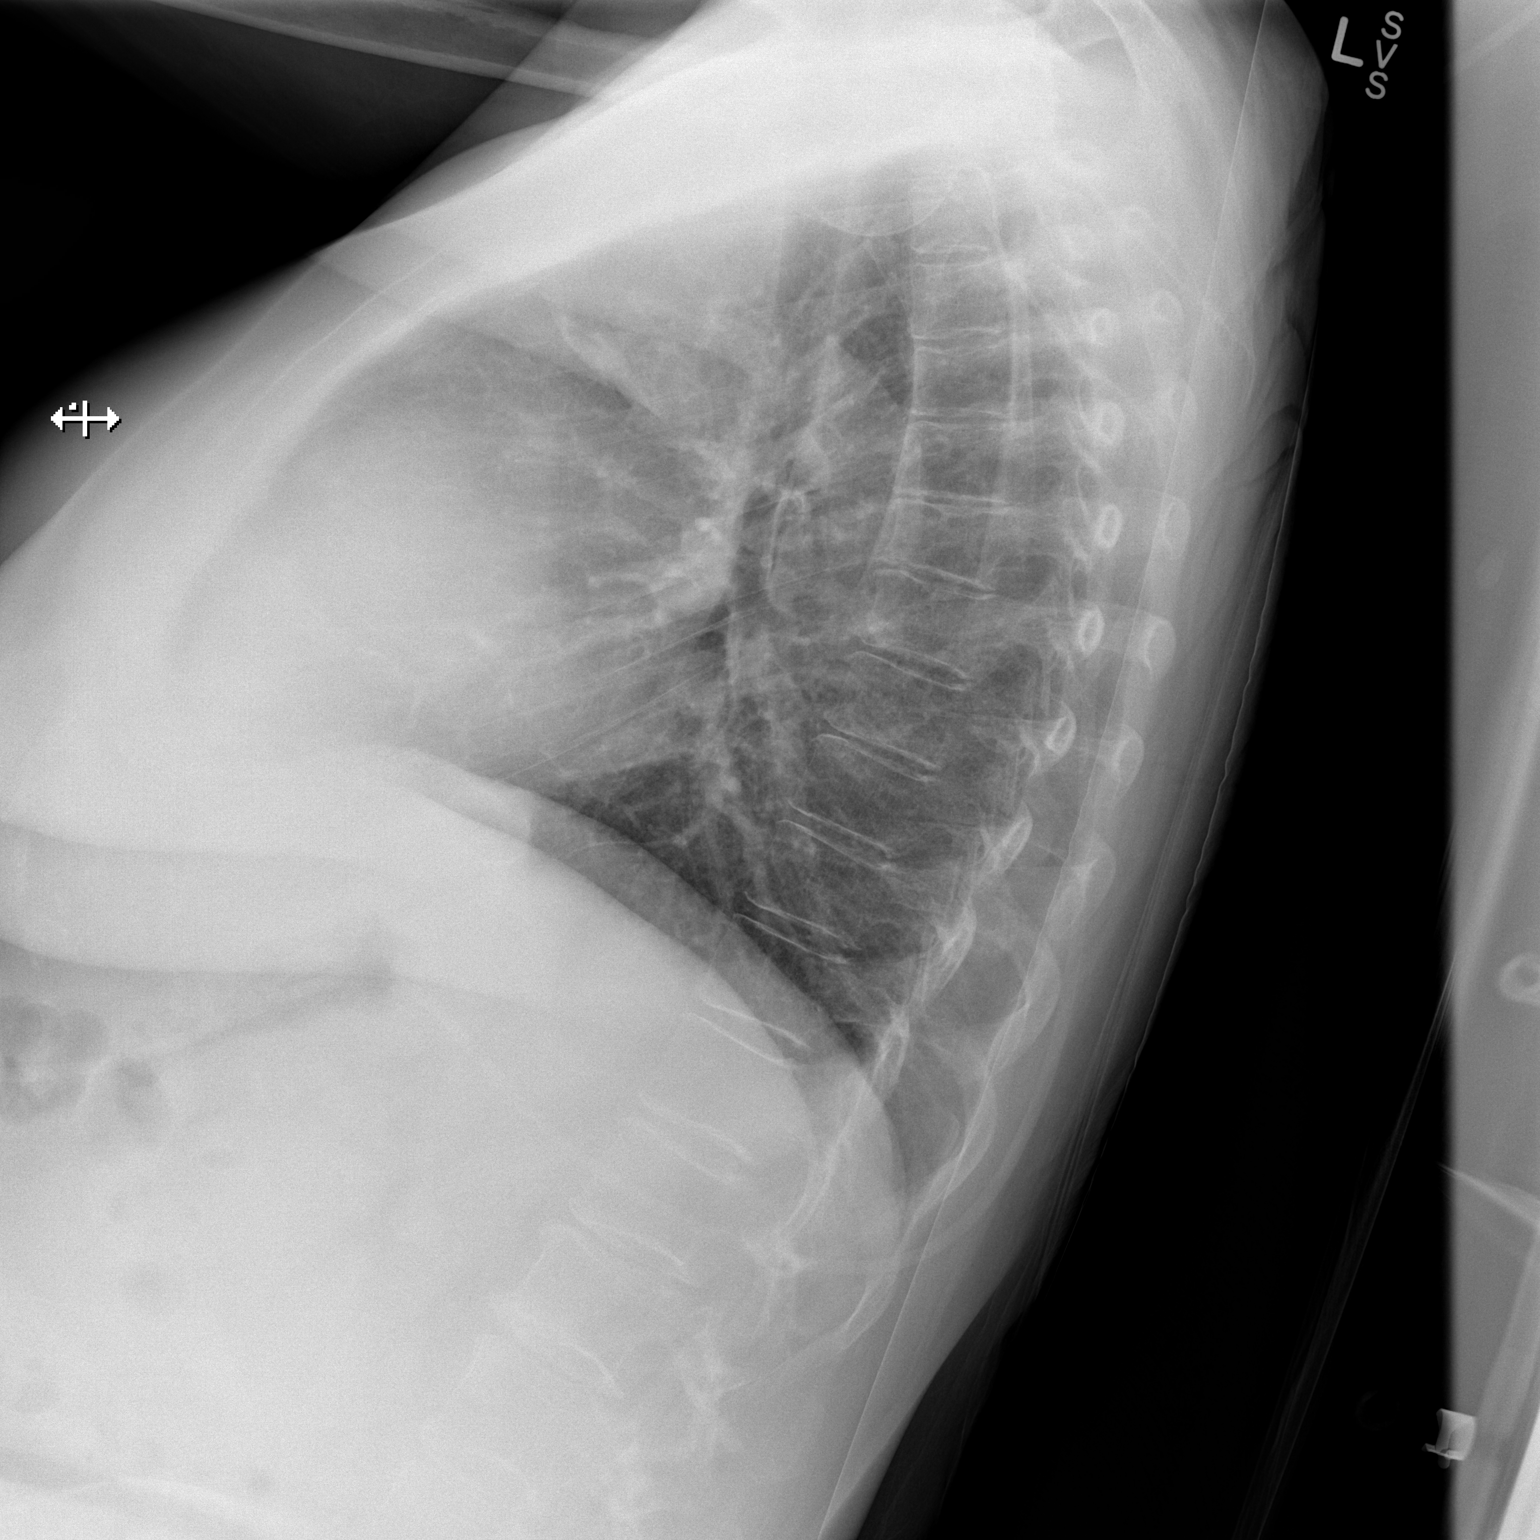

[2 of 2 positions shown; findings below may reference images not displayed]

FINDINGS: The lungs are clear.  Cardiomediastinal silhouette is
within normal limits. The lungs are clear. No pleural effusion.  No
pneumothorax.  No acute osseous abnormality. Soft tissue density
over the bilateral lung bases corresponds to a previously seen
breast implants.
IMPRESSION: No acute cardiopulmonary process.

## 2013-10-20 DEATH — deceased

## 2014-07-01 IMAGING — CR DG CHEST 2V
2 series · 2 of 2 positions shown · non-contrast
Comparison: DG CHEST 2 VIEW dated 04/14/2012

CLINICAL DATA: Chest pain, throwing up blood

EXAM:
CHEST  2 VIEW

[x chest ap]
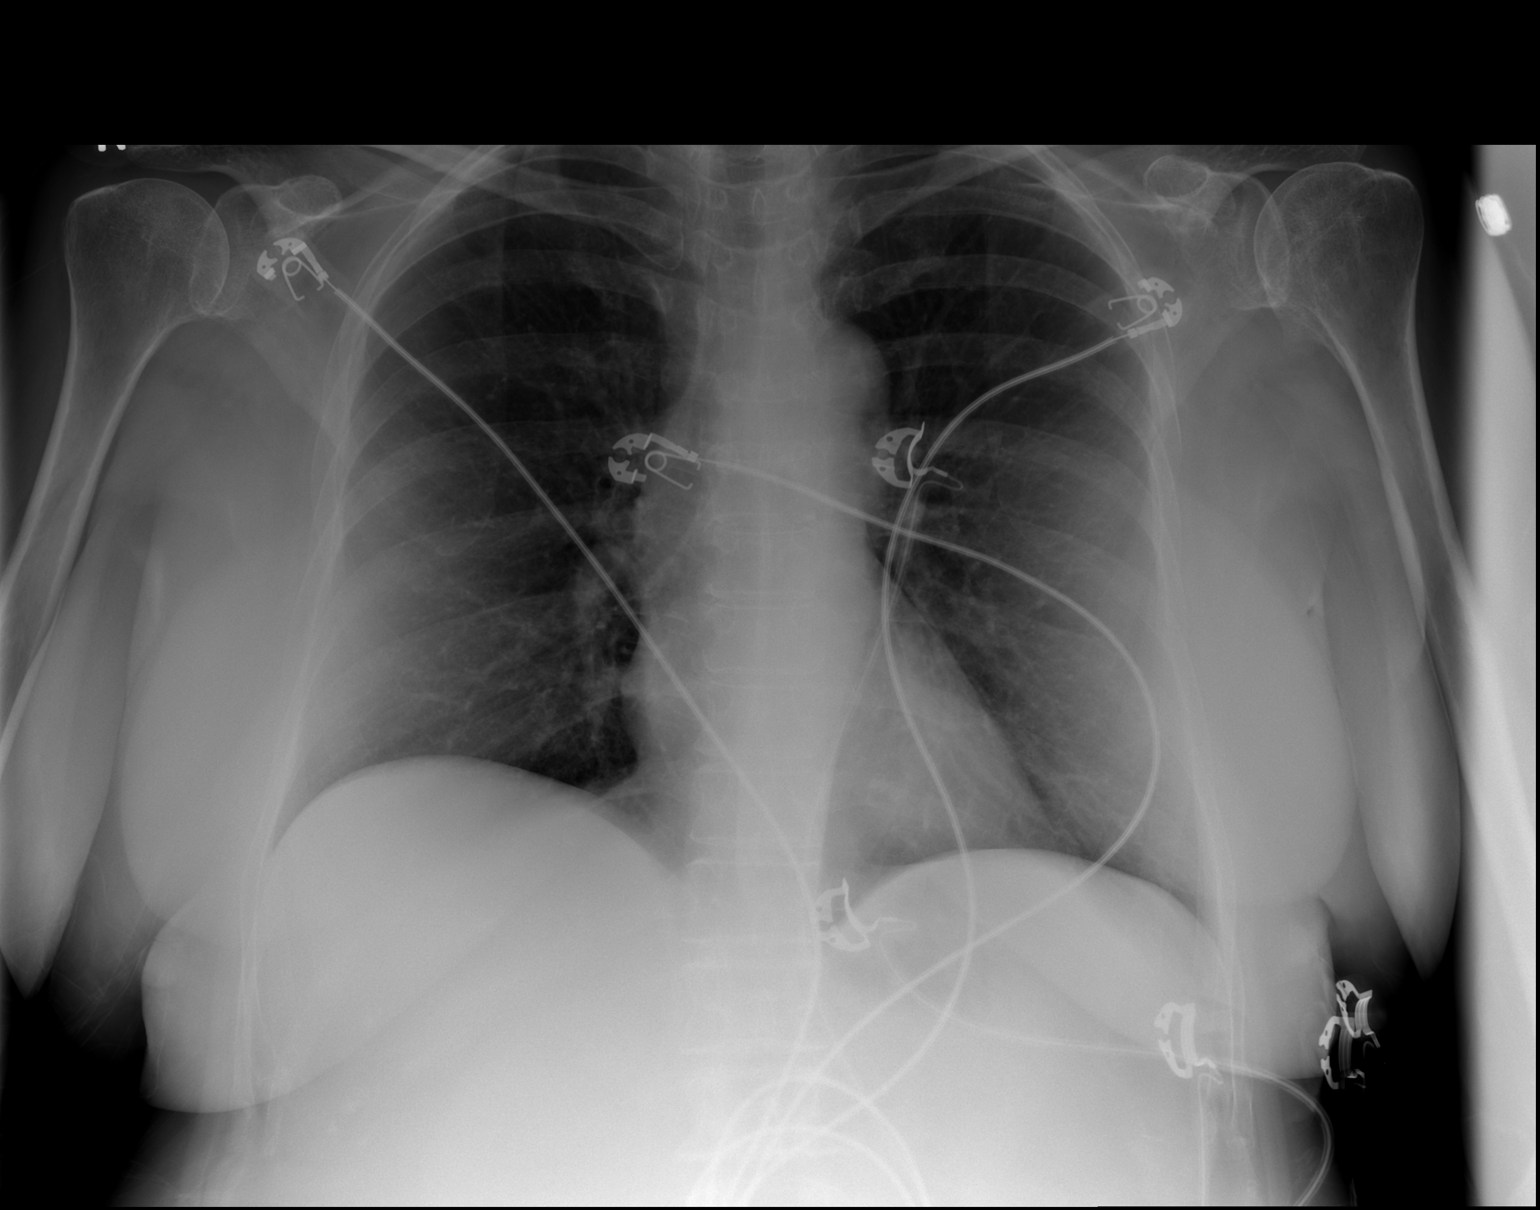

[w chest lat]
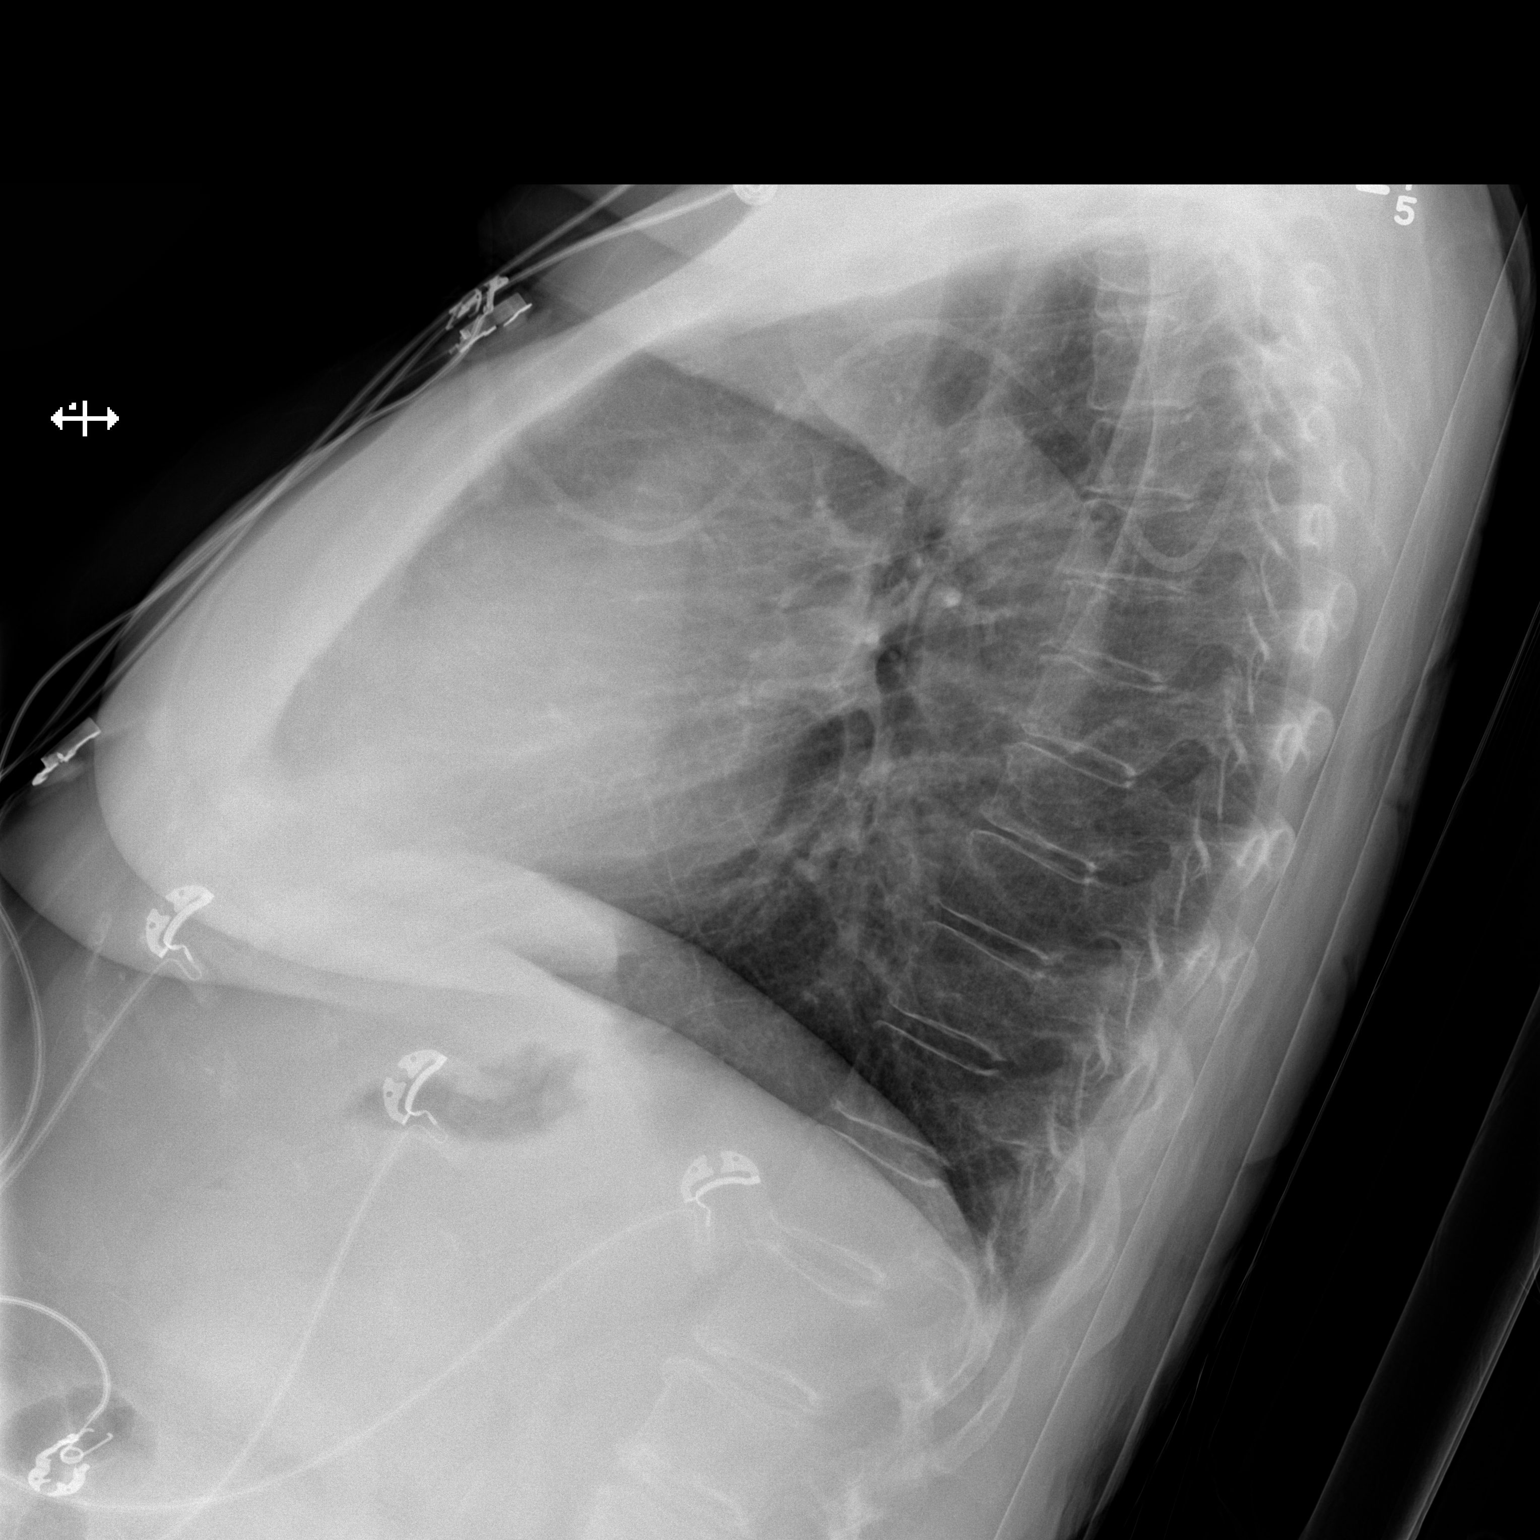

[2 of 2 positions shown; findings below may reference images not displayed]

FINDINGS: The heart size and mediastinal contours are within normal limits.
Both lungs are clear. The visualized skeletal structures are
unremarkable.
IMPRESSION: No active cardiopulmonary disease.
